# Patient Record
Sex: Female | Born: 1965 | Race: Black or African American | Hispanic: No | Marital: Single | Smoking: Never smoker
Health system: Southern US, Community
[De-identification: ages and names within clinical notes are randomized; demographics above are authoritative.]

## PROBLEM LIST (undated history)

## (undated) DIAGNOSIS — E039 Hypothyroidism, unspecified: Secondary | ICD-10-CM

## (undated) DIAGNOSIS — D649 Anemia, unspecified: Secondary | ICD-10-CM

## (undated) DIAGNOSIS — K219 Gastro-esophageal reflux disease without esophagitis: Secondary | ICD-10-CM

## (undated) DIAGNOSIS — I10 Essential (primary) hypertension: Secondary | ICD-10-CM

## (undated) HISTORY — PX: MYOMECTOMY: SHX85

## (undated) HISTORY — PX: GASTRIC BYPASS: SHX52

---

## 1998-08-17 ENCOUNTER — Encounter: Admission: RE | Admit: 1998-08-17 | Discharge: 1998-08-17 | Payer: Self-pay | Admitting: Sports Medicine

## 1998-08-19 ENCOUNTER — Encounter: Admission: RE | Admit: 1998-08-19 | Discharge: 1998-08-19 | Payer: Self-pay | Admitting: Family Medicine

## 1998-08-30 ENCOUNTER — Encounter: Admission: RE | Admit: 1998-08-30 | Discharge: 1998-08-30 | Payer: Self-pay | Admitting: Family Medicine

## 1998-10-20 ENCOUNTER — Encounter: Admission: RE | Admit: 1998-10-20 | Discharge: 1998-10-20 | Payer: Self-pay | Admitting: Family Medicine

## 1998-10-20 ENCOUNTER — Encounter: Payer: Self-pay | Admitting: Infectious Diseases

## 1998-10-20 ENCOUNTER — Ambulatory Visit (HOSPITAL_COMMUNITY): Admission: RE | Admit: 1998-10-20 | Discharge: 1998-10-20 | Payer: Self-pay | Admitting: Obstetrics and Gynecology

## 1999-01-12 ENCOUNTER — Encounter: Admission: RE | Admit: 1999-01-12 | Discharge: 1999-01-12 | Payer: Self-pay | Admitting: Family Medicine

## 1999-01-27 ENCOUNTER — Encounter: Admission: RE | Admit: 1999-01-27 | Discharge: 1999-01-27 | Payer: Self-pay | Admitting: Family Medicine

## 1999-02-08 ENCOUNTER — Encounter: Admission: RE | Admit: 1999-02-08 | Discharge: 1999-02-08 | Payer: Self-pay | Admitting: Family Medicine

## 1999-03-23 ENCOUNTER — Encounter: Admission: RE | Admit: 1999-03-23 | Discharge: 1999-03-23 | Payer: Self-pay | Admitting: Family Medicine

## 1999-11-22 ENCOUNTER — Encounter: Admission: RE | Admit: 1999-11-22 | Discharge: 1999-11-22 | Payer: Self-pay | Admitting: Sports Medicine

## 1999-11-25 ENCOUNTER — Encounter: Admission: RE | Admit: 1999-11-25 | Discharge: 1999-11-25 | Payer: Self-pay | Admitting: Family Medicine

## 2000-03-14 ENCOUNTER — Emergency Department (HOSPITAL_COMMUNITY): Admission: EM | Admit: 2000-03-14 | Discharge: 2000-03-14 | Payer: Self-pay

## 2000-07-10 ENCOUNTER — Encounter: Admission: RE | Admit: 2000-07-10 | Discharge: 2000-07-10 | Payer: Self-pay | Admitting: Sports Medicine

## 2000-07-29 ENCOUNTER — Emergency Department (HOSPITAL_COMMUNITY): Admission: EM | Admit: 2000-07-29 | Discharge: 2000-07-29 | Payer: Self-pay | Admitting: *Deleted

## 2000-07-30 ENCOUNTER — Emergency Department (HOSPITAL_COMMUNITY): Admission: EM | Admit: 2000-07-30 | Discharge: 2000-07-30 | Payer: Self-pay | Admitting: Emergency Medicine

## 2000-08-01 ENCOUNTER — Encounter: Admission: RE | Admit: 2000-08-01 | Discharge: 2000-08-01 | Payer: Self-pay | Admitting: Family Medicine

## 2001-04-01 ENCOUNTER — Other Ambulatory Visit: Admission: RE | Admit: 2001-04-01 | Discharge: 2001-04-01 | Payer: Self-pay | Admitting: Obstetrics and Gynecology

## 2001-04-05 ENCOUNTER — Ambulatory Visit (HOSPITAL_COMMUNITY): Admission: RE | Admit: 2001-04-05 | Discharge: 2001-04-05 | Payer: Self-pay | Admitting: Obstetrics and Gynecology

## 2001-04-05 ENCOUNTER — Encounter: Payer: Self-pay | Admitting: Obstetrics and Gynecology

## 2001-04-16 ENCOUNTER — Encounter: Admission: RE | Admit: 2001-04-16 | Discharge: 2001-04-16 | Payer: Self-pay | Admitting: Internal Medicine

## 2001-04-16 ENCOUNTER — Encounter: Payer: Self-pay | Admitting: Internal Medicine

## 2001-05-15 ENCOUNTER — Encounter: Admission: RE | Admit: 2001-05-15 | Discharge: 2001-08-13 | Payer: Self-pay | Admitting: Internal Medicine

## 2002-01-14 ENCOUNTER — Encounter: Admission: RE | Admit: 2002-01-14 | Discharge: 2002-04-14 | Payer: Self-pay | Admitting: Internal Medicine

## 2003-04-21 ENCOUNTER — Encounter: Admission: RE | Admit: 2003-04-21 | Discharge: 2003-04-21 | Payer: Self-pay | Admitting: Internal Medicine

## 2003-04-22 ENCOUNTER — Ambulatory Visit (HOSPITAL_COMMUNITY): Admission: RE | Admit: 2003-04-22 | Discharge: 2003-04-22 | Payer: Self-pay | Admitting: Internal Medicine

## 2003-04-24 ENCOUNTER — Ambulatory Visit (HOSPITAL_COMMUNITY): Admission: RE | Admit: 2003-04-24 | Discharge: 2003-04-24 | Payer: Self-pay | Admitting: Internal Medicine

## 2003-07-17 ENCOUNTER — Emergency Department (HOSPITAL_COMMUNITY): Admission: EM | Admit: 2003-07-17 | Discharge: 2003-07-17 | Payer: Self-pay | Admitting: Family Medicine

## 2003-07-19 ENCOUNTER — Emergency Department (HOSPITAL_COMMUNITY): Admission: AD | Admit: 2003-07-19 | Discharge: 2003-07-19 | Payer: Self-pay | Admitting: Family Medicine

## 2003-08-12 ENCOUNTER — Encounter: Admission: RE | Admit: 2003-08-12 | Discharge: 2003-08-12 | Payer: Self-pay | Admitting: General Surgery

## 2003-08-20 ENCOUNTER — Encounter: Admission: RE | Admit: 2003-08-20 | Discharge: 2003-08-20 | Payer: Self-pay | Admitting: General Surgery

## 2003-09-09 ENCOUNTER — Encounter: Payer: Self-pay | Admitting: Cardiology

## 2003-09-09 ENCOUNTER — Ambulatory Visit (HOSPITAL_COMMUNITY): Admission: RE | Admit: 2003-09-09 | Discharge: 2003-09-09 | Payer: Self-pay | Admitting: General Surgery

## 2003-09-25 ENCOUNTER — Encounter: Admission: RE | Admit: 2003-09-25 | Discharge: 2003-10-23 | Payer: Self-pay | Admitting: Family Medicine

## 2003-11-24 ENCOUNTER — Inpatient Hospital Stay (HOSPITAL_COMMUNITY): Admission: RE | Admit: 2003-11-24 | Discharge: 2003-11-27 | Payer: Self-pay | Admitting: General Surgery

## 2004-01-13 ENCOUNTER — Encounter: Admission: RE | Admit: 2004-01-13 | Discharge: 2004-02-24 | Payer: Self-pay | Admitting: Family Medicine

## 2004-04-15 ENCOUNTER — Encounter: Admission: RE | Admit: 2004-04-15 | Discharge: 2004-07-14 | Payer: Self-pay | Admitting: Family Medicine

## 2004-09-24 ENCOUNTER — Inpatient Hospital Stay (HOSPITAL_COMMUNITY): Admission: EM | Admit: 2004-09-24 | Discharge: 2004-09-26 | Payer: Self-pay | Admitting: Emergency Medicine

## 2004-12-07 ENCOUNTER — Emergency Department (HOSPITAL_COMMUNITY): Admission: EM | Admit: 2004-12-07 | Discharge: 2004-12-07 | Payer: Self-pay | Admitting: Emergency Medicine

## 2005-01-13 ENCOUNTER — Encounter: Admission: RE | Admit: 2005-01-13 | Discharge: 2005-01-13 | Payer: Self-pay | Admitting: Occupational Medicine

## 2005-01-31 ENCOUNTER — Emergency Department (HOSPITAL_COMMUNITY): Admission: EM | Admit: 2005-01-31 | Discharge: 2005-01-31 | Payer: Self-pay | Admitting: Family Medicine

## 2005-04-22 ENCOUNTER — Emergency Department (HOSPITAL_COMMUNITY): Admission: EM | Admit: 2005-04-22 | Discharge: 2005-04-22 | Payer: Self-pay | Admitting: Family Medicine

## 2005-08-21 ENCOUNTER — Emergency Department (HOSPITAL_COMMUNITY): Admission: EM | Admit: 2005-08-21 | Discharge: 2005-08-21 | Payer: Self-pay | Admitting: Family Medicine

## 2005-12-21 ENCOUNTER — Emergency Department (HOSPITAL_COMMUNITY): Admission: EM | Admit: 2005-12-21 | Discharge: 2005-12-21 | Payer: Self-pay | Admitting: Family Medicine

## 2006-05-02 ENCOUNTER — Emergency Department (HOSPITAL_COMMUNITY): Admission: EM | Admit: 2006-05-02 | Discharge: 2006-05-02 | Payer: Self-pay | Admitting: Family Medicine

## 2008-06-02 ENCOUNTER — Ambulatory Visit: Payer: Self-pay | Admitting: Diagnostic Radiology

## 2008-06-02 ENCOUNTER — Emergency Department (HOSPITAL_BASED_OUTPATIENT_CLINIC_OR_DEPARTMENT_OTHER): Admission: EM | Admit: 2008-06-02 | Discharge: 2008-06-02 | Payer: Self-pay | Admitting: Emergency Medicine

## 2008-06-03 ENCOUNTER — Emergency Department (HOSPITAL_BASED_OUTPATIENT_CLINIC_OR_DEPARTMENT_OTHER): Admission: EM | Admit: 2008-06-03 | Discharge: 2008-06-03 | Payer: Self-pay | Admitting: Emergency Medicine

## 2008-06-03 ENCOUNTER — Ambulatory Visit (HOSPITAL_BASED_OUTPATIENT_CLINIC_OR_DEPARTMENT_OTHER): Admission: RE | Admit: 2008-06-03 | Discharge: 2008-06-03 | Payer: Self-pay | Admitting: Emergency Medicine

## 2008-06-03 ENCOUNTER — Ambulatory Visit: Payer: Self-pay | Admitting: Diagnostic Radiology

## 2008-11-17 ENCOUNTER — Emergency Department (HOSPITAL_BASED_OUTPATIENT_CLINIC_OR_DEPARTMENT_OTHER): Admission: EM | Admit: 2008-11-17 | Discharge: 2008-11-17 | Payer: Self-pay | Admitting: Emergency Medicine

## 2008-11-17 ENCOUNTER — Ambulatory Visit: Payer: Self-pay | Admitting: Diagnostic Radiology

## 2008-11-19 ENCOUNTER — Encounter: Admission: RE | Admit: 2008-11-19 | Discharge: 2008-11-19 | Payer: Self-pay | Admitting: Obstetrics and Gynecology

## 2008-12-08 ENCOUNTER — Ambulatory Visit (HOSPITAL_BASED_OUTPATIENT_CLINIC_OR_DEPARTMENT_OTHER): Admission: RE | Admit: 2008-12-08 | Discharge: 2008-12-08 | Payer: Self-pay | Admitting: Obstetrics and Gynecology

## 2008-12-08 ENCOUNTER — Ambulatory Visit: Payer: Self-pay | Admitting: Diagnostic Radiology

## 2009-07-16 ENCOUNTER — Ambulatory Visit: Payer: Self-pay | Admitting: Diagnostic Radiology

## 2009-07-16 ENCOUNTER — Emergency Department (HOSPITAL_BASED_OUTPATIENT_CLINIC_OR_DEPARTMENT_OTHER): Admission: EM | Admit: 2009-07-16 | Discharge: 2009-07-16 | Payer: Self-pay | Admitting: Emergency Medicine

## 2010-04-01 ENCOUNTER — Emergency Department (HOSPITAL_BASED_OUTPATIENT_CLINIC_OR_DEPARTMENT_OTHER)
Admission: EM | Admit: 2010-04-01 | Discharge: 2010-04-02 | Payer: Self-pay | Source: Home / Self Care | Admitting: Emergency Medicine

## 2010-04-01 ENCOUNTER — Ambulatory Visit: Payer: Self-pay | Admitting: Diagnostic Radiology

## 2010-04-28 IMAGING — US US TRANSVAGINAL NON-OB
1 series · 13 of 25 positions shown · non-contrast
Comparison: CT scan [DATE].  Pelvic ultrasound 06/03/2008.

CLINICAL DATA: Abnormal CT of the abdomen and pelvis.  Fibroids.
Progressive left lower quadrant abdominal pain pain radiating into
the low back

TRANSABDOMINAL AND TRANSVAGINAL ULTRASOUND OF PELVIS
TECHNIQUE: Both transabdominal and transvaginal ultrasound
examinations of the pelvis were performed including evaluation of
the uterus, ovaries, adnexal regions, and pelvic cul-de-sac.

[Series 1: us transvaginal non-ob · 0.33mm/px · 13 of 95 slices shown]
[im 1/95]
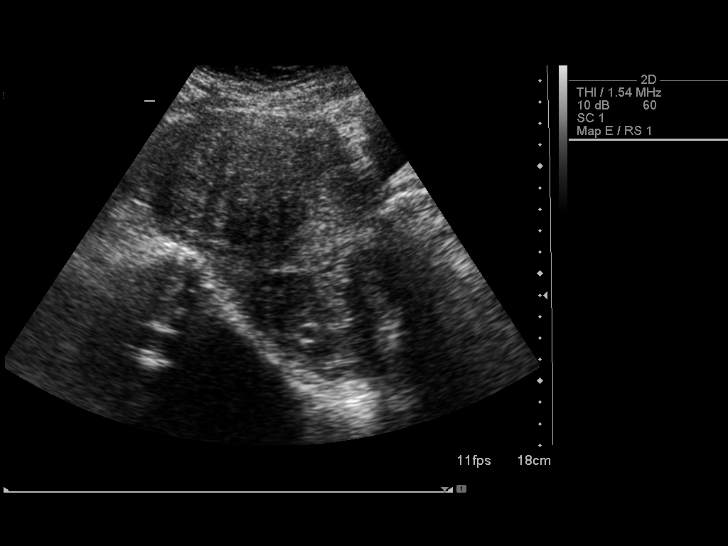
[im 8/95]
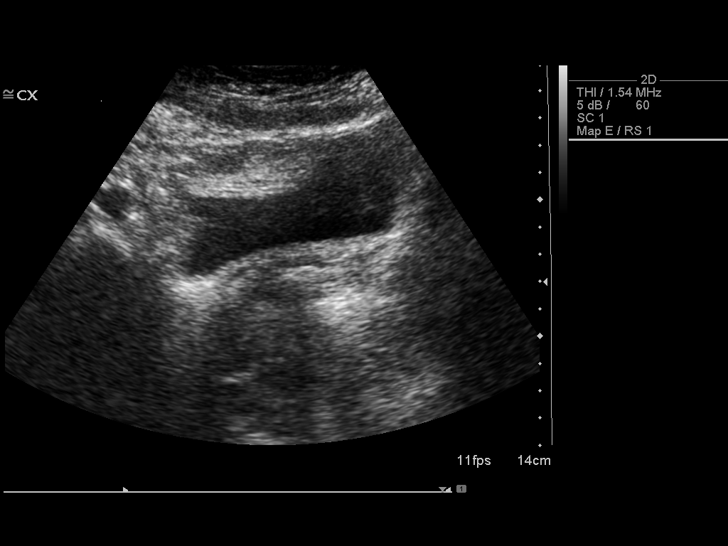
[im 16/95]
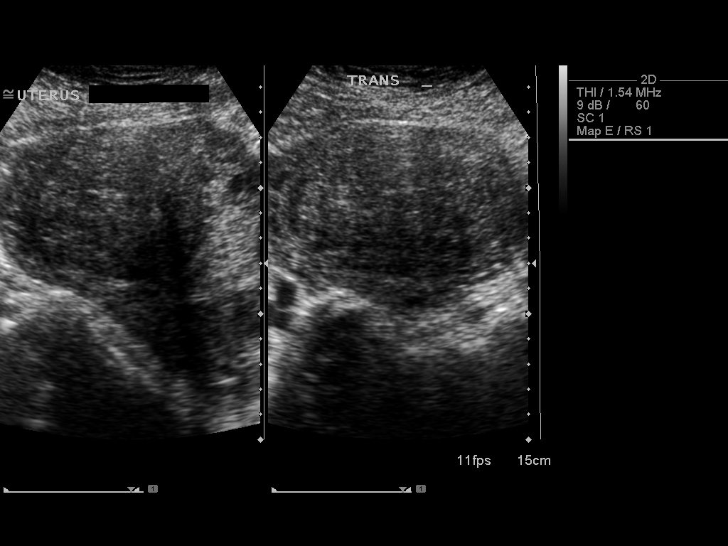
[im 24/95]
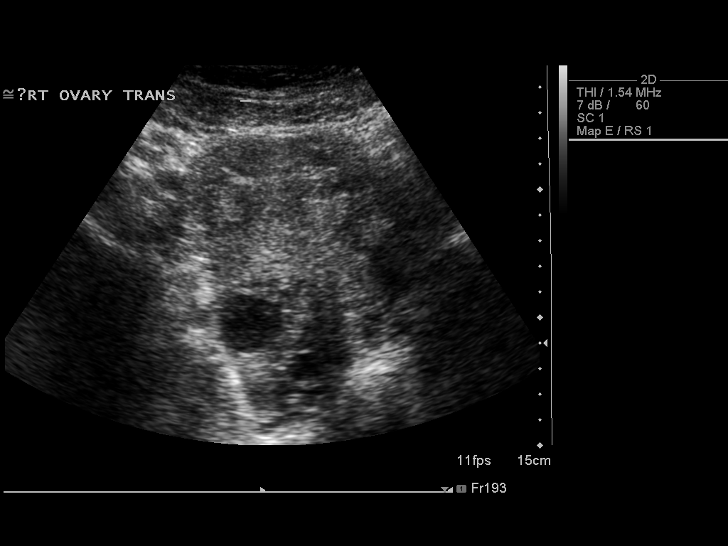
[im 32/95]
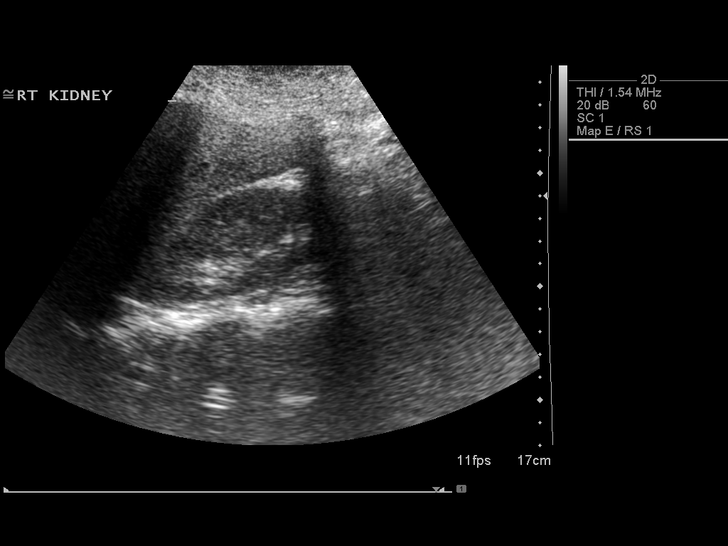
[im 40/95]
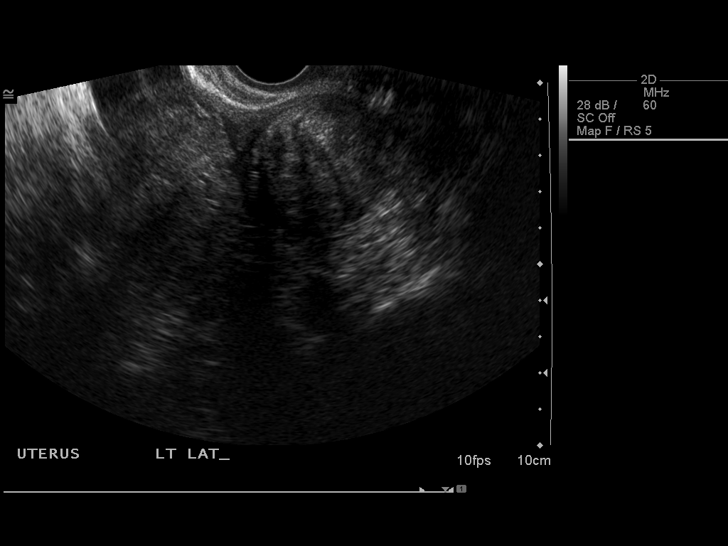
[im 48/95]
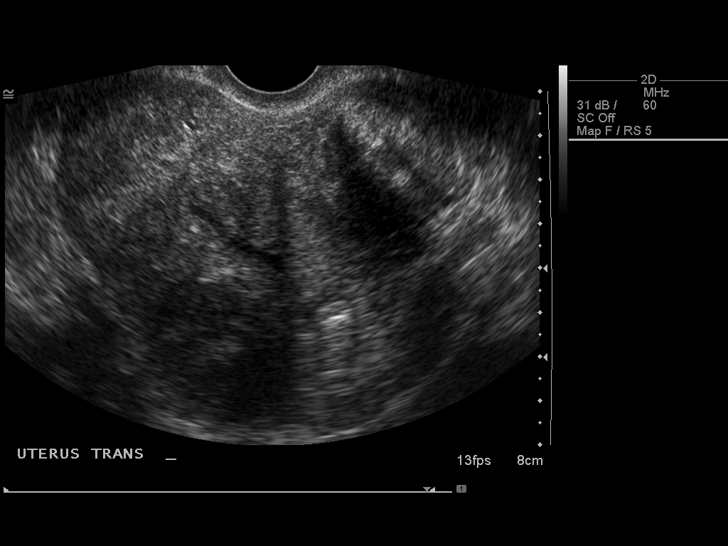
[im 55/95]
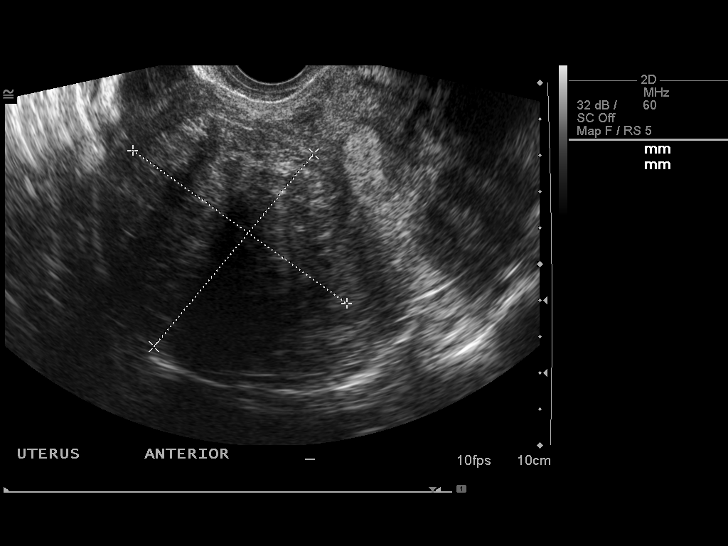
[im 63/95]
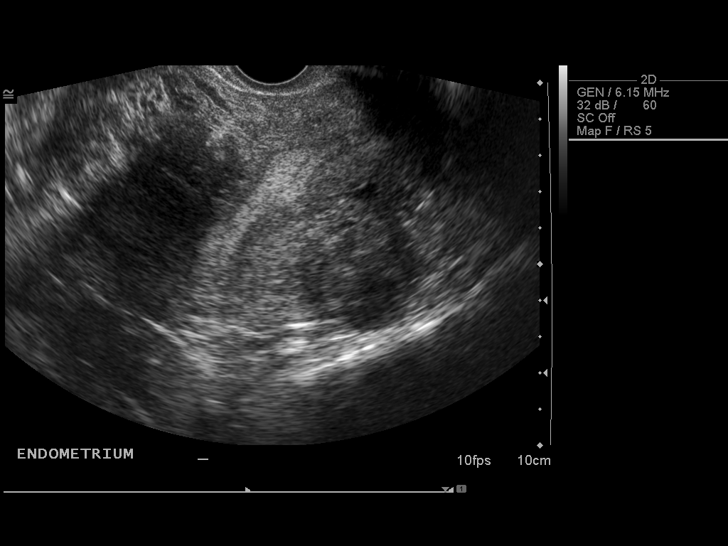
[im 71/95]
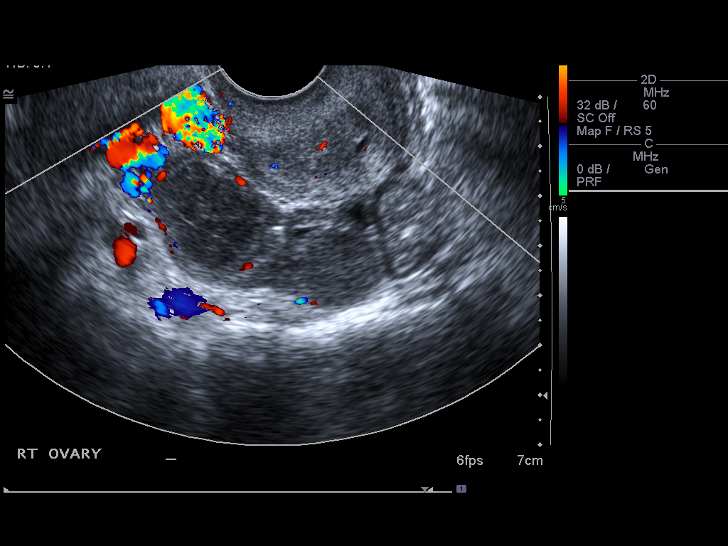
[im 79/95]
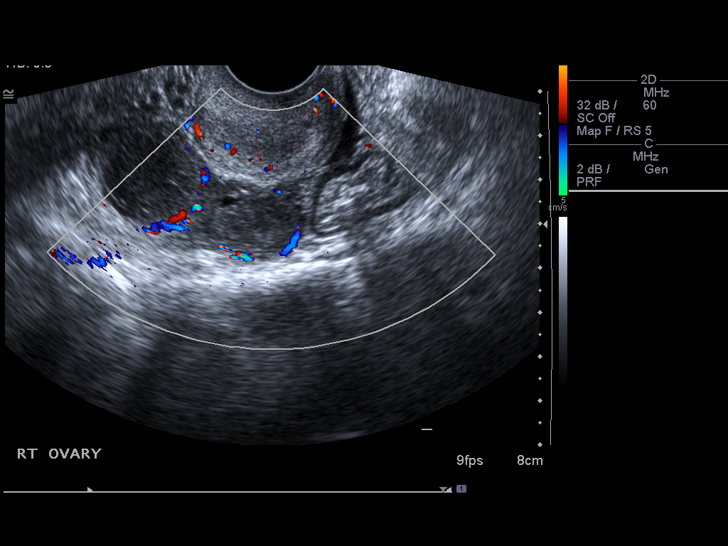
[im 87/95]
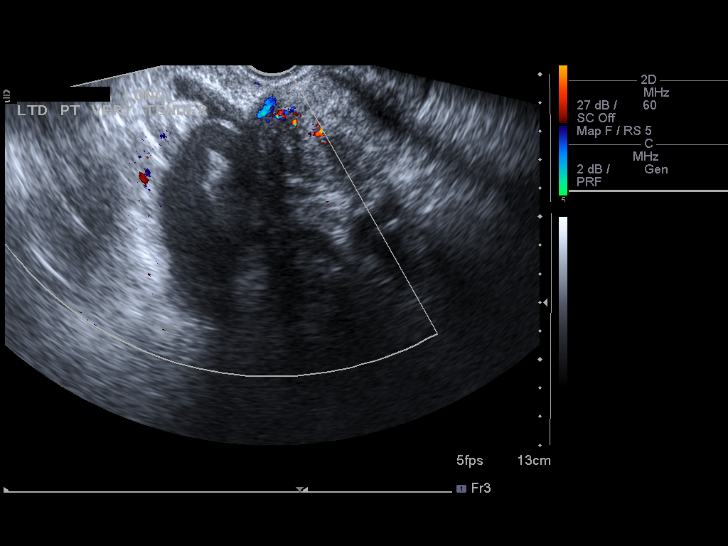
[im 95/95]
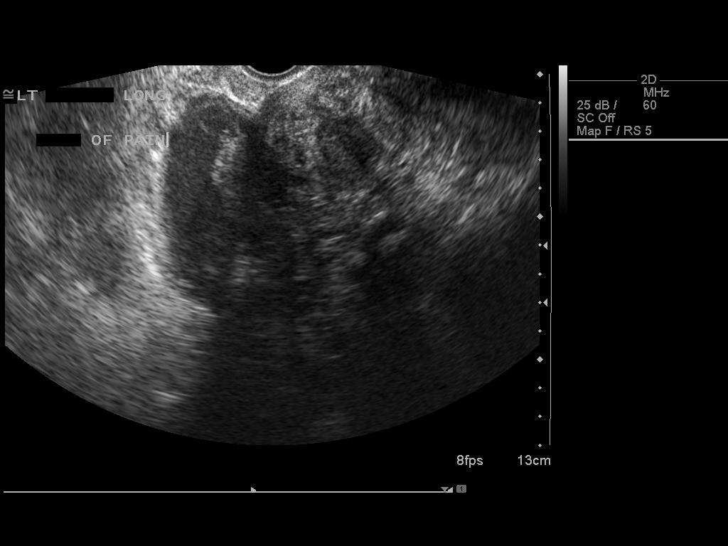

[13 of 25 positions shown; findings below may reference images not displayed]

FINDINGS: Uterus:  The uterus is enlarged measuring 17.0 x 10.0 x 10.6 cm.
Multiple uterine fibroids are again seen.  The largest lesion is
within the fundus, measuring 6.8 x 7.8 x 8.9 cm.  More posteriorly
the dominant lesion measures 4.1 x 3.5 x 3.7 cm.  The largest
lesion on the left side of uterus measures 5.5 x 3.8 x 4.5 cm. This
represents significant interval growth of these fibroids.  No
definite new fibroids are seen.

Endometrium:  The endometrial canal is somewhat distorted by the
fibroids.  It measures 13.6 mm maximally.

Right Ovary:  The right ovary measures 4.9 x 2.2 x 4.2 cm.  There
are two complex cystic lesions within the ovary.  They measure
x 2.2 x 2.6 cm and 2.3 x 1.7 x 2.2 cm respectively.

Left Ovary:  The left ovary is of normal size and echo texture
measuring 3.9 x 3.4 x 3.1 cm.

Other Findings:  A tubular structure is again seen adjacent to the
left adnexa.  This is slightly smaller than on the previous exam
but still measures up to 2 cm in maximal width.  A small amount of
free fluid is noted.
IMPRESSION: 1.  Interval enlargement of multiple uterine fibroids.
2.  Persistent left sided hydrosalpinx.  This is in the area of the
patient's tenderness.
3.  Enlarging complex cystic lesions of the right ovary.  These are
not clearly simple follicles.  Hemorrhagic cysts are considered.
Neoplasm is considered less likely.  Recommend follow-up ultrasound
at 3-6 months.

## 2010-06-27 ENCOUNTER — Encounter: Payer: Self-pay | Admitting: Obstetrics and Gynecology

## 2010-08-03 ENCOUNTER — Emergency Department (INDEPENDENT_AMBULATORY_CARE_PROVIDER_SITE_OTHER): Payer: Managed Care, Other (non HMO)

## 2010-08-03 ENCOUNTER — Emergency Department (HOSPITAL_BASED_OUTPATIENT_CLINIC_OR_DEPARTMENT_OTHER)
Admission: EM | Admit: 2010-08-03 | Discharge: 2010-08-03 | Disposition: A | Discharge status: Home / Self Care | Payer: Managed Care, Other (non HMO) | Attending: Emergency Medicine | Admitting: Emergency Medicine

## 2010-08-03 DIAGNOSIS — I10 Essential (primary) hypertension: Secondary | ICD-10-CM | POA: Insufficient documentation

## 2010-08-03 DIAGNOSIS — M65979 Unspecified synovitis and tenosynovitis, unspecified ankle and foot: Secondary | ICD-10-CM | POA: Insufficient documentation

## 2010-08-03 DIAGNOSIS — M25579 Pain in unspecified ankle and joints of unspecified foot: Secondary | ICD-10-CM

## 2010-08-03 DIAGNOSIS — E039 Hypothyroidism, unspecified: Secondary | ICD-10-CM | POA: Insufficient documentation

## 2010-08-03 DIAGNOSIS — M79609 Pain in unspecified limb: Secondary | ICD-10-CM

## 2010-08-03 DIAGNOSIS — M659 Synovitis and tenosynovitis, unspecified: Secondary | ICD-10-CM | POA: Insufficient documentation

## 2010-08-17 LAB — URINALYSIS, ROUTINE W REFLEX MICROSCOPIC
Bilirubin Urine: NEGATIVE
Glucose, UA: NEGATIVE mg/dL
Hgb urine dipstick: NEGATIVE
Specific Gravity, Urine: 1.027 (ref 1.005–1.030)

## 2010-09-12 LAB — URINALYSIS, ROUTINE W REFLEX MICROSCOPIC
Bilirubin Urine: NEGATIVE
Glucose, UA: NEGATIVE mg/dL
Hgb urine dipstick: NEGATIVE
Ketones, ur: NEGATIVE mg/dL
pH: 6 (ref 5.0–8.0)

## 2010-09-12 LAB — PREGNANCY, URINE: Preg Test, Ur: NEGATIVE

## 2010-10-21 NOTE — Discharge Summary (Signed)
[name], [name]                       ACCOUNT NO.:  [id number]   MEDICAL RECORD NO.:  [id number]                   PATIENT TYPE:  INP   LOCATION:  0454                                 FACILITY:  [facility]   PHYSICIAN:  Sharlet Salina T. Hoxworth, M.D.          DATE OF BIRTH:  1965/10/06   DATE OF ADMISSION:  11/24/2003  DATE OF DISCHARGE:  11/27/2003                                 DISCHARGE SUMMARY   DISCHARGE DIAGNOSES:  Morbid obesity.   OPERATION/PROCEDURE:  Laparoscopic Roux-Y gastric bypass November 24, 2003, Dr.  Johna Sheriff.   HISTORY OF PRESENT ILLNESS:  [name] is a 45 year old black female  with a long history of progressive morbid obesity unresponsive to medical  management.  After extensive preoperative evaluation and discussion, we have  elected to proceed with laparoscopic Roux-Y gastric bypass for correction of  her obesity.  She is admitted for this procedure.   PAST MEDICAL HISTORY:  She has had no surgeries.  She is followed for  hypertension, hypothyroidism, and mild iron deficiency anemia.   MEDICATIONS:  1. Hydrochlorothiazide 37.5 mg a day.  2. Synthroid 75 mcg daily.  3. Chromagen daily.   No known drug allergies.   Social History, Family History, Review of Systems as in the dictated H&P.   PERTINENT PHYSICAL EXAMINATION:  VITAL SIGNS:  She is 5 feet 7 inches, 325  pounds for a BMI of 51.1.  The remainder of physical exam was unremarkable.   HOSPITAL COURSE:  The patient was admitted on the morning of her procedure  and underwent an uneventful laparoscopic Roux-Y gastric bypass.  Her  postoperative course was smooth.  She had normal Gastrografin swallow on the  first postoperative day.  Vital signs were stable.  She was begun on clear  liquids and was able to be advanced  to full liquids.  She steadily improved and was felt ready for discharge on  June 24th.  She was afebrile, vital signs were stable, abdomen is benign,  tolerating diet.   FOLLOW  UP:  In my office in 1 week.                                               Lorne Skeens. Hoxworth, M.D.    Tory Emerald  D:  12/09/2003  T:  12/09/2003  Job:  [id number]   cc:   Candyce Churn. Allyne Gee, M.D.  [address]  [address]  [place]  [place] [address]  Fax: [phone]

## 2010-10-21 NOTE — Op Note (Signed)
[name]             ACCOUNT NO.:  [id number]   MEDICAL RECORD NO.:  [id number]          PATIENT TYPE:  INP   LOCATION:  0446                         FACILITY:  [facility]   PHYSICIAN:  Sharlet Salina T. Hoxworth, M.D.DATE OF BIRTH:  May 06, 1966   DATE OF PROCEDURE:  09/24/2004  DATE OF DISCHARGE:                                 OPERATIVE REPORT   PREOPERATIVE DIAGNOSIS:  Small bowel obstruction.   POSTOPERATIVE DIAGNOSIS:  Small bowel obstruction, secondary to single  adhesive band.   SURGICAL PROCEDURE:  Laparoscopic lysis of adhesions for small bowel  obstruction.   SURGEON:  Dr. Johna Sheriff   ASSISTANT:  Dr. Darnell Level   ANESTHESIA:  General.   BRIEF HISTORY:  [name] is a 45 year old female, 9 months following  uncomplicated laparoscopic Roux-Y gastric bypass for morbid obesity.  She  has done well with excellent weight loss of over 100 pounds and has not had  any complaints of abdominal pain up to this point.  She now, however,  presents with 18 hours of crampy mid abdominal pain, and CT scan been  obtained showing evidence of small bowel obstruction with multiple loops of  dilated small bowel and decompressed distal small bowel.  No definite  internal hernia was able to be seen on CT.  Laparoscopic exploration for  bowel obstruction has been recommended and accepted.  The nature of the  procedure, indications, risks of bleeding, infection, intestinal injury,  possible need for open procedure were discussed and understood.  She is now  brought to the operating room for this procedure.   DESCRIPTION OF OPERATION:  The patient brought to the operating room, placed  in supine position on operating table, and general endotracheal anesthesia  was induced.  PAS were in place.  She received broad spectrum preoperative  antibiotics.  The abdomen was widely sterilely prepped and draped.  An  OptiView trocar was used to access the abdomen without difficulty through  the  previous left subcostal incision.  A pneumoperitoneum was established.  Laparoscopy revealed multiple dilated loops of small bowel and some  decompressed distal small bowel.  A second 11 mm trocar was placed in the  right mid abdomen.  There are some omental adhesions to the anterior  abdominal wall at a couple of sites and were taken down with sharp  dissection.  Another 11 mm trocar was placed in the right subcostal space  and another 11 mm trocar in the right periumbilical space, all through  previous laparoscopic incision sites.  The decompressed terminal ileum was  identified and using atraumatic graspers the bowel was traced proximally.  At about the mid ileum, the bowel was obviously tethered down toward the  mesentery, and this area was able to be exposed, and there was a single  dense adhesive band from mesentery to mesentery that had obstructed the  bowel at this point.  This clearly was not through an internal hernia and  was distal to where you would expect to encounter the jejunojejunostomy.  Right angle was able to be slipped easily under the band, and it was seen  not to  be particularly avascular.  Clips were applied to prevent any  bleeding and it was sharply divided and the bowel was immediately freed.  The band was then actually clipped more towards its origin of the base of  the mesentery and removed.  There was a clear transition zone at this point  between completely decompressed distal bowel and markedly dilated proximal  bowel.  I traced the dilated proximal bowel proximally; however, due to the  significantly distended multiple loops of bowel, there was not much room for  careful exploration, and I felt that risk of injuring the bowel was not  worth further exploration, as clearly the entire remainder of the proximal  bowel was dilated, and this was not the result of any internal hernia that  required repair.  I was, however, not really able to safely visualize the   jejunojejunostomy.  We were, however, confident that the obstruction was  relieved.  At this point, trocars were removed under direct vision, all CO2  evacuated.  Skin incisions were closed with staples.  The patient taken to  recovery in good condition.      BTH/MEDQ  D:  09/24/2004  T:  09/24/2004  Job:  [id number]

## 2010-10-21 NOTE — H&P (Signed)
[name], [name]             ACCOUNT NO.:  [id number]   MEDICAL RECORD NO.:  [id number]          PATIENT TYPE:  INP   LOCATION:  0101                         FACILITY:  [facility]   PHYSICIAN:  Angelia Mould. Derrell Lolling, M.D.DATE OF BIRTH:  [birth year]   DATE OF ADMISSION:  09/23/2004  DATE OF DISCHARGE:                                HISTORY & PHYSICAL   CHIEF COMPLAINT:  Abdominal pain.   HISTORY OF PRESENT ILLNESS:  This is a 45 year old black female who  underwent an uneventful Roux-en-Y gastric bypass for morbid obesity on November 24, 2003 by Dr. Johna Sheriff. She has done well with good weight loss, having  lost from 341 pounds down to 223 pounds.   Yesterday she had lunch at [employer] and about 20 minutes after eating she  developed sudden onset of crampy, centralized abdominal pain. She stated  that the pain would come and go, was moderately severe, would tend to occur  on the right side, left side, and even in the back. She called one of the  nutritional nurses and according to her was advised to eat bland foods, and  when she ate some further food the pain hurt worse. She was not vomiting at  home. Her bowel movements have been normal and daily although she did not  have a bowel movement yesterday. She came to the [facility] emergency room,  has received Phenergan and Dilaudid, and states that the pain has now  resolved. She had abdominal x-rays which were not remarkable but she had a  CT scan which showed a diltation of the jejunum with collapse of the ileum  and colon. There was no free air, minimal free fluid, no bowel wall  thickening. She is being admitted for further evaluation and management.   PAST HISTORY:  Laparoscopic Roux-en-Y gastric bypass November 24, 2003. She has  not had any other surgeries. She has hypertension. She had hypothyroidism.  She has a history of mild anemia.   CURRENT MEDICATIONS:  1.  Synthroid 100 mcg per day.  2.  Triamterene 37.5 mg a day.  3.   Prenatal vitamins.   DRUG ALLERGIES:  None known.   SOCIAL HISTORY:  She is single, lives alone in [place], has no children.  Works as a Paediatric nurse on the weekends and works for the [employer]  on [employer] during the day. Denies the use of alcohol or tobacco.   FAMILY HISTORY:  Mother living, has hypertension and fibromyalgia. Father  living, has hypertension. She has a brother and a sister both living and  well.   REVIEW OF SYSTEMS:  Fifteen-system review of systems was performed. It is  noncontributory except as described above.   PHYSICAL EXAMINATION:  GENERAL:  Obese black female in minimal distress.  Cooperative.  VITAL SIGNS:  Temperature 97.1, pulse 100, respirations 18, blood pressure  138/88.  HEENT:  Eyes:  Sclerae clear, extraocular movements intact, no exophthalmos.  Ear, nose, mouth and throat, nose, lips, tongue, and oropharynx are without  gross lesions.  NECK:  Supple, nontender, no mass, no jugular venous distention.  LUNGS:  Clear to auscultation. No chest wall tenderness.  HEART:  Regular rate and rhythm. Radial, femoral, and retrotibial pulses are  palpable.  BREASTS:  Not examined.  ABDOMEN:  Somewhat obese, soft, with minimal tenderness, no guarding, no  rebound. There is no mass. There is no hernia. I cannot feel any enlargement  of the liver or spleen. She may be slightly tympanitic and may be slightly  distended but this is very subtle. All the trocar sites are well healed.  EXTREMITIES:  She moves all four extremities well without pain or deformity.  NEUROLOGIC:  No gross motor or sensory deficits.   ADMISSION DATA:  CT scan suggesting either ileus or proximal small-bowel  obstruction as described above. Potassium of 2.8. She also has a calcium of  9.1. Liver function tests are pending. Lipase of 15. White blood cell count  is 12,500.   IMPRESSION:  1.  Abdominal pain. This is possibly secondary to a partial small-bowel       obstruction either from adhesions or an internal hernia, or could be due      to motility disorder from her hypokalemia.  2.  There is no evidence of compromised bowel at this time.  3.  Status post laparoscopic Roux-en-Y gastric bypass.  4.  Hypokalemia.  5.  Hypertension.  6.  Hypothyroidism.   PLAN:  1.  The patient will be admitted for observation. We will keep her n.p.o.      and repeat her labs and x-rays tomorrow.  2.  I have discussed her care with Dr. Johna Sheriff who agrees with conservative      management at this point. Dr. Johna Sheriff says he will stop by to see her      sometime in the next 24 hours.      HMI/MEDQ  D:  09/24/2004  T:  09/24/2004  Job:  [id number]   cc:   Candyce Churn. Allyne Gee, M.D.  [address]  [address]  [place]  [place] [address]  Fax: [phone]   Lorne Skeens. Hoxworth, M.D.  [address], [address]  [place]  [place] [address]

## 2010-10-21 NOTE — Discharge Summary (Signed)
[name], [name]             ACCOUNT NO.:  [id number]   MEDICAL RECORD NO.:  [id number]          PATIENT TYPE:  INP   LOCATION:  0446                         FACILITY:  [facility]   PHYSICIAN:  Sharlet Salina T. Hoxworth, M.D.DATE OF BIRTH:  [birth year]   DATE OF ADMISSION:  09/23/2004  DATE OF DISCHARGE:  09/26/2004                                 DISCHARGE SUMMARY   DISCHARGE DIAGNOSIS:  Small bowel obstruction secondary to single adhesive  bands.   PRINCIPAL PROCEDURE:  Laparoscopic lysis of adhesions for small bowel  obstruction on September 24, 2004.   HISTORY OF PRESENT ILLNESS:  [name] is a 45 year old female status  post uncomplicated laparoscopic Roux-en-Y gastric bypass in July 2005.  She  has had excellent weight loss and has done very well following surgery until  24 hours prior to admission when she developed crampy pressure-like  midabdominal pain.  She presented to the emergency room and continues to  have pain.  CT scans obtained which shows small bowel obstruction.  Urgent  laparoscopy has been recommended and accepted, the patient is admitted for  this procedure.   PAST MEDICAL HISTORY:  Significant as above, plus history of hypertension,  hypothyroidism.   MEDICATIONS:  1.  Synthroid 100 mcg daily.  2.  Triamterine 37.5 daily.   NO KNOWN DRUG ALLERGIES.   SOCIAL HISTORY/FAMILY HISTORY/REVIEW OF SYSTEMS:  See dictated H&P.   PHYSICAL EXAMINATION:  She is afebrile, pulse 100, respirations 18, blood  pressure 130/88.  GENERAL:  A mildly obese, in some pain.  ABDOMEN:  Shows some moderate diffuse tenderness.   CT findings as above.   HOSPITAL COURSE:  The patient was admitted and underwent laparoscopy and was  found to have a single adhesive band causing a distal small bowel  obstruction, unrelated to any internal hernia.  The patient felt better  immediately postoperatively.  She was started on a clear liquid diet on the  first postoperative day which she  tolerated well.  By the second  postoperative day she was up and ambulatory, had minimal abdominal  discomfort, abdomen was nontender.  She was discharged home at this time.  Follow-up is to be in my office in two weeks.  Discharge medications are the  same as admission plus Vicodin as needed for pain.    BTH/MEDQ  D:  11/08/2004  T:  11/08/2004  Job:  [id number]

## 2010-10-21 NOTE — Op Note (Signed)
[name], [name]                       ACCOUNT NO.:  [id number]   MEDICAL RECORD NO.:  [id number]                   PATIENT TYPE:  INP   LOCATION:  0454                                 FACILITY:  [facility]   PHYSICIAN:  Vikki Ports, M.D.         DATE OF BIRTH:  1966-05-28   DATE OF PROCEDURE:  11/24/2003  DATE OF DISCHARGE:                                 OPERATIVE REPORT   PREOPERATIVE DIAGNOSIS:  Morbid obesity.   POSTOPERATIVE DIAGNOSIS:  Morbid obesity, no leak, patent anastomosis.   PROCEDURE:  Upper endoscopy.   SURGEON:  Danna Hefty, M.D.   DESCRIPTION OF PROCEDURE:  At the conclusion of laparoscopic Roux-en-Y  gastric bypass, I introduced an Olympus endoscope into the oropharynx.  The  esophagus was inspected and was normal.  The Z-line was found to be at 40  cm.  Pouch was inspected and was approximately 5 cm in length.  Anastomosis  was patent.  No evidence of leak.  It was fully distended.  Scope was  removed.  The patient tolerated the procedure well.  The remainder of the  report will be dictated by Jaclynn Guarneri, MD.                                               Vikki Ports, M.D.    KRH/MEDQ  D:  11/25/2003  T:  11/25/2003  Job:  [id number]

## 2010-10-21 NOTE — Op Note (Signed)
[name], [name]                       ACCOUNT NO.:  [id number]   MEDICAL RECORD NO.:  [id number]                   PATIENT TYPE:  INP   LOCATION:  X001                                 FACILITY:  [facility]   PHYSICIAN:  Sharlet Salina T. Hoxworth, M.D.          DATE OF BIRTH:  06/19/1965   DATE OF PROCEDURE:  11/24/2003  DATE OF DISCHARGE:                                 OPERATIVE REPORT   PREOPERATIVE DIAGNOSIS:  Morbid obesity.   POSTOPERATIVE DIAGNOSIS:  Morbid obesity.   OPERATION/PROCEDURE:  Laparoscopic Roux-Y gastric bypass.   SURGEON:  Lorne Skeens. Hoxworth, M.D.   ASSISTANT:  Vikki Ports, M.D.   ANESTHESIA:  General.   BRIEF HISTORY:  [name] is a 45 year old black female with a long  history of progressive morbid obesity with co-morbidities of hypertension  and early arthritis.  She has failed repeated nonsurgical attempts at weight  loss and after extensive discussion and evaluation detailed elsewhere, we  have elected to proceed with laparoscopic Roux-Y gastric bypass for  correction of her obesity.   DESCRIPTION OF PROCEDURE:  The patient was brought to the operating room,  placed in the supine position on the operating table and general  endotracheal anesthesia was induced.  She had undergone a mechanical  antibiotic bowel prep.  Lovenox 40 mg had been given subcutaneous.  PAS were  in place.  The abdomen was widely sterilely prepped and draped.  She was  given preoperative IV antibiotics.  Local anesthesia was infiltrated at the  trocar sites prior to the incisions.   A 1 cm incision was made in the left subcostal space, mid abdomen and using  an Optiview trocar, access was gained to the peritoneal cavity without  difficulty and pneumoperitoneum was established.  Under direct vision, 11 mm  trocars were placed in the right mid abdomen just off the midline, left mid  abdomen, right upper quadrant through the falciform ligament and a 5 mm  trocar  placed in the left flank.  The omentum and transverse mesocolon were  elevated and ligament of Treitz identified.  A 40 cm afferent small bowel  limb was measured and at this point, the bowel was quite mobile up toward  the liver.  The bowel was divided with a single firing of the endo-GIA  vascular 45 mm stapler.  The mesentery was then further divided a short  distance with the Harmonic scalpel to gain increased mobility.  Both ends of  the bowel were pink and healthy-appearing.  The end of the efferent limb was  marked by suturing a Penrose drain to it.  A 100 cm efferent limb was then  measured.  At this point a jejunojejunostomy was created with enterotomies  with Harmonic scalpel and a single firing of a endo-GIA 45 mm vascular  stapler.  The common enterotomy was closed with running 2-0 Vicryl beginning  at either end of the enterotomy and tied centrally.  The  mesenteric defect  behind the afferent limb was closed with a running 2-0 silk carrying this  out onto the jejunojejunostomy to completely close this defect.  Tisseel  tissue sealant was used over the stapled suture lines.   Following this, the patient was placed in the reverse Trendelenburg.  A 5 mm  trocar was placed in the epigastrium and through this opening, a large  Nathenson retractor was placed and the left lobe of the liver elevated with  excellent exposure of the hiatus and upper stomach.  The angle of His was  mobilized, dividing peritoneum at this point and mobilizing angle of His off  the left crus.  A point along the lesser curve was chosen for division of  the stomach, 4-5 cm from the EG junction.  All tubes were removed from the  stomach.  Peritoneum was dissected along the lesser curve and dissection was  carried back posterior to the stomach at right angles for several  centimeters.  Additional firing of endo-GIA blue-load stapler was performed  at right angles to the lesser curve.  Further dissection behind  the gastric  pouch entered the free lesser sac.  Three more firings of the stapler were  used to create a small tubular pouch up to the angle of His which was  completely divided with the last firing.  Following this, the efferent limb  was brought up without undue tension.  A posterior row of running 2-0 Vicryl  was placed between the efferent limb and the gastric pouch.  Ewald tube was  advanced into the pouch and enterotomies created in the distal pouch and  efferent limb.  Through these enterotomies, the endo-GIA 45 mm blue-load  stapler was introduced 2-2.5 cm without difficulty and fired creating a nice  anastomosis with tacked staple line and no bleeding.  The common enterotomy  was then closed from either end with running 2-0 Vicryl.  The Ewald tube was  then advanced through the anastomosis and an outer layer of seromuscular  running 2-0 Vicryl was placed.  Following this, the efferent limb was  clamped and the patient was endoscoped with nice distention of the pouch and  under saline, there was no evidence of any leak.  Anastomosis appeared  patent.  There was no bleeding.  The air was suctioned.  The Tisseel tissue  sealant was used over the suture and staple lines of the gastric pouch and  gastrojejunostomy.  A closed suction drain was brought out through one of  the lateral trocar sites and left in the subhepatic space.  Trocars were  removed and all CO2 evacuated.  Sponge, needle and instrument counts were  correct.  Skin incisions were closed with staples.  The patient was taken to  the recovery room in good condition having tolerated the procedure well.                                               Lorne Skeens. Hoxworth, M.D.    Tory Emerald  D:  11/24/2003  T:  11/24/2003  Job:  [id number]

## 2010-12-28 ENCOUNTER — Other Ambulatory Visit (HOSPITAL_BASED_OUTPATIENT_CLINIC_OR_DEPARTMENT_OTHER): Payer: Self-pay | Admitting: Internal Medicine

## 2010-12-28 DIAGNOSIS — Z1231 Encounter for screening mammogram for malignant neoplasm of breast: Secondary | ICD-10-CM

## 2010-12-30 ENCOUNTER — Ambulatory Visit (HOSPITAL_BASED_OUTPATIENT_CLINIC_OR_DEPARTMENT_OTHER): Payer: Managed Care, Other (non HMO)

## 2011-01-09 ENCOUNTER — Ambulatory Visit (HOSPITAL_BASED_OUTPATIENT_CLINIC_OR_DEPARTMENT_OTHER): Payer: Managed Care, Other (non HMO)

## 2011-01-16 ENCOUNTER — Ambulatory Visit (HOSPITAL_BASED_OUTPATIENT_CLINIC_OR_DEPARTMENT_OTHER)
Admission: RE | Admit: 2011-01-16 | Discharge: 2011-01-16 | Disposition: A | Discharge status: Home / Self Care | Payer: Managed Care, Other (non HMO) | Source: Ambulatory Visit | Attending: Internal Medicine | Admitting: Internal Medicine

## 2011-01-16 DIAGNOSIS — Z1231 Encounter for screening mammogram for malignant neoplasm of breast: Secondary | ICD-10-CM

## 2011-02-25 ENCOUNTER — Encounter: Payer: Self-pay | Admitting: *Deleted

## 2011-02-25 ENCOUNTER — Emergency Department (HOSPITAL_BASED_OUTPATIENT_CLINIC_OR_DEPARTMENT_OTHER)
Admission: EM | Admit: 2011-02-25 | Discharge: 2011-02-25 | Disposition: A | Discharge status: Home / Self Care | Payer: Managed Care, Other (non HMO) | Attending: Emergency Medicine | Admitting: Emergency Medicine

## 2011-02-25 DIAGNOSIS — K219 Gastro-esophageal reflux disease without esophagitis: Secondary | ICD-10-CM | POA: Insufficient documentation

## 2011-02-25 DIAGNOSIS — E079 Disorder of thyroid, unspecified: Secondary | ICD-10-CM | POA: Insufficient documentation

## 2011-02-25 DIAGNOSIS — I10 Essential (primary) hypertension: Secondary | ICD-10-CM | POA: Insufficient documentation

## 2011-02-25 HISTORY — DX: Anemia, unspecified: D64.9

## 2011-02-25 HISTORY — DX: Hypothyroidism, unspecified: E03.9

## 2011-02-25 HISTORY — DX: Essential (primary) hypertension: I10

## 2011-02-25 NOTE — ED Provider Notes (Signed)
Medical screening examination/treatment/procedure(s) were performed by non-physician practitioner and as supervising physician I was immediately available for consultation/collaboration.   Shelda Jakes, MD 02/25/11 1236

## 2011-02-25 NOTE — ED Provider Notes (Signed)
History     CSN: [id number] Arrival date & time: 02/25/2011 11:22 AM  Chief Complaint  Patient presents with  . Sore Throat    HPI  (Consider location/radiation/quality/duration/timing/severity/associated sxs/prior treatment)  HPI Comments: Pt states that she has a burning in her throat after eating and it fells like she is having some "acid in her mouth"  Patient is a 45 y.o. female presenting with pharyngitis. The history is provided by the patient.  Sore Throat This is a new problem. The current episode started yesterday. The problem occurs 2 to 4 times per day. Associated symptoms include fatigue and a sore throat. Pertinent negatives include no fever, headaches or swollen glands. The symptoms are aggravated by eating. She has tried nothing for the symptoms.    Past Medical History  Diagnosis Date  . Hypothyroid   . Anemia   . Hypertension     Past Surgical History  Procedure Date  . Gastric bypass   . Myomectomy     No family history on file.  History  Substance Use Topics  . Smoking status: Never Smoker   . Smokeless tobacco: Not on file  . Alcohol Use: Yes    OB History    Grav Para Term Preterm Abortions TAB SAB Ect Mult Living                  Review of Systems  Review of Systems  Constitutional: Positive for fatigue. Negative for fever.  HENT: Positive for sore throat. Negative for nosebleeds and rhinorrhea.   Respiratory: Negative for chest tightness and shortness of breath.   Neurological: Negative for headaches.  All other systems reviewed and are negative.    Allergies  Review of patient's allergies indicates not on file.  Home Medications   Current Outpatient Rx  Name Route Sig Dispense Refill  . SYNTHROID PO Oral Take by mouth.      . TRIAMTERENE-HCTZ 37.5-25 MG PO CAPS Oral Take 1 capsule by mouth every morning.        Physical Exam    BP 125/72  Pulse 80  Temp(Src) 99 F (37.2 C) (Oral)  Resp 18  SpO2 99%  Physical Exam    Nursing note and vitals reviewed. Constitutional: She is oriented to person, place, and time. She appears well-developed and well-nourished.  HENT:  Head: Normocephalic and atraumatic.  Right Ear: External ear normal.  Left Ear: External ear normal.  Mouth/Throat: Oropharynx is clear and moist.  Eyes: Conjunctivae are normal. Pupils are equal, round, and reactive to light.  Neck: Normal range of motion.  Cardiovascular: Normal rate and regular rhythm.   Pulmonary/Chest: Breath sounds normal.  Musculoskeletal: Normal range of motion.  Neurological: She is alert and oriented to person, place, and time.  Skin: Skin is dry.  Psychiatric: She has a normal mood and affect.    ED Course  Procedures (including critical care time)   Results for orders placed during the hospital encounter of 02/25/11  RAPID STREP SCREEN      Component Value Range   Streptococcus, Group A Screen (Direct) NEGATIVE  NEGATIVE      MDM Exam not and history not consistent with a strep:pt history is consistent with reflux:ill treat pt on out pt basis        Teressa Lower, NP 02/25/11 1230

## 2011-02-25 NOTE — ED Notes (Signed)
Patient states that yesterday, her throat started hurting, hurts to swallow, feels like it is on fire, feels fatigued

## 2011-02-27 ENCOUNTER — Emergency Department (INDEPENDENT_AMBULATORY_CARE_PROVIDER_SITE_OTHER): Payer: Managed Care, Other (non HMO)

## 2011-02-27 ENCOUNTER — Encounter (HOSPITAL_BASED_OUTPATIENT_CLINIC_OR_DEPARTMENT_OTHER): Payer: Self-pay | Admitting: *Deleted

## 2011-02-27 ENCOUNTER — Emergency Department (HOSPITAL_BASED_OUTPATIENT_CLINIC_OR_DEPARTMENT_OTHER)
Admission: EM | Admit: 2011-02-27 | Discharge: 2011-02-27 | Disposition: A | Discharge status: Home / Self Care | Payer: Managed Care, Other (non HMO) | Attending: Emergency Medicine | Admitting: Emergency Medicine

## 2011-02-27 DIAGNOSIS — I10 Essential (primary) hypertension: Secondary | ICD-10-CM | POA: Insufficient documentation

## 2011-02-27 DIAGNOSIS — IMO0002 Reserved for concepts with insufficient information to code with codable children: Secondary | ICD-10-CM

## 2011-02-27 DIAGNOSIS — M25569 Pain in unspecified knee: Secondary | ICD-10-CM

## 2011-02-27 DIAGNOSIS — M7989 Other specified soft tissue disorders: Secondary | ICD-10-CM | POA: Insufficient documentation

## 2011-02-27 DIAGNOSIS — R609 Edema, unspecified: Secondary | ICD-10-CM

## 2011-02-27 DIAGNOSIS — E039 Hypothyroidism, unspecified: Secondary | ICD-10-CM | POA: Insufficient documentation

## 2011-02-27 DIAGNOSIS — M1710 Unilateral primary osteoarthritis, unspecified knee: Secondary | ICD-10-CM

## 2011-02-27 DIAGNOSIS — M79609 Pain in unspecified limb: Secondary | ICD-10-CM

## 2011-02-27 NOTE — ED Notes (Signed)
MD at bedside speaking with pt about test results

## 2011-02-27 NOTE — ED Notes (Signed)
Pt here to have right leg evaluated states she was seen here on Friday but didn't mention it then and it isn't getting any better reports that her leg below her knee feels swollen to her also states her hip is " giving her trouble" pt denies any injury to leg. Denies shortness of breath, denies travel states she does sit at work.

## 2011-02-27 NOTE — ED Provider Notes (Signed)
History     CSN: [id number] Arrival date & time: 02/27/2011  9:17 AM  Chief Complaint  Patient presents with  . Leg Pain    HPI  (Consider location/radiation/quality/duration/timing/severity/associated sxs/prior treatment)  Patient is a 45 y.o. female presenting with leg pain. The history is provided by the patient.  Leg Pain  The incident occurred 2 days ago. There was no injury mechanism. The pain is present in the right knee. The quality of the pain is described as aching. The pain is moderate. The pain has been fluctuating since onset. Pertinent negatives include no numbness and no loss of sensation. The symptoms are aggravated by activity and palpation.  right knee and lower leg pain. Swelling into the lower leg compared to the left. No chest pain or dyspnea. No travel. No smoking. No trauma.  Past Medical History  Diagnosis Date  . Hypothyroid   . Anemia   . Hypertension     Past Surgical History  Procedure Date  . Gastric bypass   . Myomectomy     History reviewed. No pertinent family history.  History  Substance Use Topics  . Smoking status: Never Smoker   . Smokeless tobacco: Not on file  . Alcohol Use: Yes    OB History    Grav Para Term Preterm Abortions TAB SAB Ect Mult Living                  Review of Systems  Review of Systems  Constitutional: Negative for chills.  Respiratory: Negative for cough, choking, chest tightness and shortness of breath.   Cardiovascular: Positive for leg swelling. Negative for chest pain and palpitations.  Gastrointestinal: Negative for abdominal pain.  Genitourinary: Negative for difficulty urinating.  Musculoskeletal: Negative for back pain and joint swelling.  Skin: Negative for color change.  Neurological: Negative for numbness.    Allergies  Review of patient's allergies indicates no known allergies.  Home Medications   Current Outpatient Rx  Name Route Sig Dispense Refill  . SYNTHROID PO Oral Take by  mouth.      . TRIAMTERENE-HCTZ 37.5-25 MG PO CAPS Oral Take 1 capsule by mouth every morning.        Physical Exam    BP 123/67  Pulse 88  Temp(Src) 98.3 F (36.8 C) (Oral)  Resp 18  SpO2 97%  Physical Exam  Nursing note and vitals reviewed. Constitutional: She is oriented to person, place, and time. She appears well-developed and well-nourished.  HENT:  Head: Normocephalic and atraumatic.  Eyes: EOM are normal. Pupils are equal, round, and reactive to light.  Neck: Normal range of motion. Neck supple.  Cardiovascular: Normal rate, regular rhythm and normal heart sounds.   No murmur heard. Pulmonary/Chest: Effort normal and breath sounds normal. No respiratory distress. She has no wheezes. She has no rales.  Abdominal: Soft. Bowel sounds are normal. She exhibits no distension. There is no tenderness. There is no rebound and no guarding.  Musculoskeletal: Normal range of motion. She exhibits edema and tenderness.       Mild pain with movement of right knee. No effusion, but fullness posteriorly. Mild edema over lower leg. Dp pulse strong.   Neurological: She is alert and oriented to person, place, and time. No cranial nerve deficit.  Skin: Skin is warm and dry.  Psychiatric: She has a normal mood and affect. Her speech is normal.    ED Course  Procedures (including critical care time)  Labs Reviewed - No data to display  No results found. Results for orders placed during the hospital encounter of 02/25/11  RAPID STREP SCREEN      Component Value Range   Streptococcus, Group A Screen (Direct) NEGATIVE  NEGATIVE    Dg Knee 1-2 Views Right  02/27/2011  *RADIOLOGY REPORT*  Clinical Data: Posterior knee pain and swelling, no injury  RIGHT KNEE - 1-2 VIEW  Comparison: None.  Findings: There is primarily bicompartmental degenerative joint disease involving the medial and patellofemoral compartments. There is loss of joint space, sclerosis, and spurring.  No acute fracture is seen.   No effusion is noted.  IMPRESSION: Primarily bicompartmental degenerative joint disease.  No acute abnormality.  Original Report Authenticated By: Juline Patch, M.D.   US Venous Img Lower Unilateral Right  02/27/2011  *RADIOLOGY REPORT*  Clinical Data: Right calf pain.  RIGHT LOWER EXTREMITY VENOUS DUPLEX ULTRASOUND  Technique: Gray-scale sonography with graded compression, as well as color Doppler and duplex ultrasound, were performed to evaluate the deep venous system of the right lower extremity from the level of the common femoral vein through the popliteal and proximal calf veins. Spectral Doppler was utilized to evaluate flow at rest and with distal augmentation maneuvers.  Comparison: None.  Findings:  Normal compressibility of  the right common femoral, superficial femoral, and popliteal veins is demonstrated, as well as the visualized proximal calf veins.  No filling defects to suggest DVT on grayscale or color Doppler imaging.  Doppler waveforms show normal direction of venous flow, normal respiratory phasicity and response to augmentation.  IMPRESSION: No evidence of  right lower extremity deep vein thrombosis.  Original Report Authenticated By: Darrol Angel, M.D.     No diagnosis found.   MDM Lower leg and knee pain. Arthritis on xray. Negative doppler. Discharged.        Juliet Rude. Rubin Payor, MD 02/27/11 1056

## 2011-03-10 LAB — URINALYSIS, ROUTINE W REFLEX MICROSCOPIC
Glucose, UA: NEGATIVE mg/dL
Nitrite: NEGATIVE
Protein, ur: NEGATIVE mg/dL
pH: 6 (ref 5.0–8.0)

## 2011-03-10 LAB — WET PREP, GENITAL
Trich, Wet Prep: NONE SEEN
Yeast Wet Prep HPF POC: NONE SEEN

## 2011-03-10 LAB — GC/CHLAMYDIA PROBE AMP, GENITAL: GC Probe Amp, Genital: NEGATIVE

## 2011-04-02 ENCOUNTER — Encounter (HOSPITAL_BASED_OUTPATIENT_CLINIC_OR_DEPARTMENT_OTHER): Payer: Self-pay | Admitting: Emergency Medicine

## 2011-04-02 ENCOUNTER — Emergency Department (INDEPENDENT_AMBULATORY_CARE_PROVIDER_SITE_OTHER): Payer: Managed Care, Other (non HMO)

## 2011-04-02 ENCOUNTER — Emergency Department (HOSPITAL_BASED_OUTPATIENT_CLINIC_OR_DEPARTMENT_OTHER)
Admission: EM | Admit: 2011-04-02 | Discharge: 2011-04-02 | Disposition: A | Discharge status: Home / Self Care | Payer: Managed Care, Other (non HMO) | Attending: Emergency Medicine | Admitting: Emergency Medicine

## 2011-04-02 DIAGNOSIS — R0602 Shortness of breath: Secondary | ICD-10-CM | POA: Insufficient documentation

## 2011-04-02 DIAGNOSIS — J40 Bronchitis, not specified as acute or chronic: Secondary | ICD-10-CM

## 2011-04-02 DIAGNOSIS — B9789 Other viral agents as the cause of diseases classified elsewhere: Secondary | ICD-10-CM | POA: Insufficient documentation

## 2011-04-02 DIAGNOSIS — R05 Cough: Secondary | ICD-10-CM

## 2011-04-02 DIAGNOSIS — R059 Cough, unspecified: Secondary | ICD-10-CM

## 2011-04-02 DIAGNOSIS — B349 Viral infection, unspecified: Secondary | ICD-10-CM

## 2011-04-02 DIAGNOSIS — J040 Acute laryngitis: Secondary | ICD-10-CM | POA: Insufficient documentation

## 2011-04-02 MED ORDER — ALBUTEROL SULFATE HFA 108 (90 BASE) MCG/ACT IN AERS
2.0000 | INHALATION_SPRAY | Freq: Four times a day (QID) | RESPIRATORY_TRACT | Status: DC
  Administered 2011-04-02: 2 via RESPIRATORY_TRACT
  Filled 2011-04-02: qty 6.7

## 2011-04-02 NOTE — ED Notes (Signed)
Pt started with runny nose, sore throat and cough since Wednesday.   Has progressed to laryngitis and woke up today with some SOB.  No acute resp distress.

## 2011-04-02 NOTE — ED Provider Notes (Signed)
History     CSN: [id number] Arrival date & time: 04/02/2011  8:03 AM   First MD Initiated Contact with Patient 04/02/11 [phone]      Chief Complaint  Patient presents with  . Sore Throat  . Nasal Congestion  . Cough  . Shortness of Breath    (Consider location/radiation/quality/duration/timing/severity/associated sxs/prior treatment) HPI Patient presents with complaint of cough nasal congestion and laryngitis. She states symptoms began approximately 4 days ago. She did have fever that first day but none sense. Her cough is nonproductive. She lost her voice yesterday. States she has felt generally fatigued. No rashes no difficulty swallowing or breathing no chest pain, no leg swelling, no nausea or vomiting or diarrhea or abdominal pain. She has not tried anything to make symptoms better. There are no other associated symptoms.  Past Medical History  Diagnosis Date  . Hypothyroid   . Anemia   . Hypertension     Past Surgical History  Procedure Date  . Gastric bypass   . Myomectomy     History reviewed. No pertinent family history.  History  Substance Use Topics  . Smoking status: Never Smoker   . Smokeless tobacco: Not on file  . Alcohol Use: No    OB History    Grav Para Term Preterm Abortions TAB SAB Ect Mult Living                  Review of Systems ROS reviewed and otherwise negative except for mentioned in HPI  Allergies  Review of patient's allergies indicates no known allergies.  Home Medications   Current Outpatient Rx  Name Route Sig Dispense Refill  . SYNTHROID PO Oral Take by mouth.      . TRIAMTERENE-HCTZ 37.5-25 MG PO CAPS Oral Take 1 capsule by mouth every morning.        BP 128/62  Pulse 94  Temp(Src) 98 F (36.7 C) (Oral)  Resp 18  Ht 5\' 7"  (1.702 m)  Wt 245 lb (111.131 kg)  BMI 38.37 kg/m2  SpO2 100%  LMP 03/22/2011 Vitals reviewed Physical Exam Physical Examination: General appearance - alert, well appearing, and in no  distress Mental status - alert, oriented to person, place, and time Mouth - mucous membranes moist, pharynx normal without lesions Neck - shotty cervical LAD Lymphatics - no palpable lymphadenopathy Chest - clear to auscultation, no wheezes, rales or rhonchi, symmetric air entry Heart - normal rate, regular rhythm, normal S1, S2, no murmurs, rubs, clicks or gallops Abdomen - soft, nontender, nondistended, no masses or organomegaly Neurological - sensory and motor exams both grossly normal Musculoskeletal - no joint tenderness, deformity or swelling Extremities - peripheral pulses normal, no pedal edema, no clubbing or cyanosis Skin - normal coloration and turgor, no rashes, no suspicious skin lesions noted  ED Course  Procedures (including critical care time)  Labs Reviewed - No data to display Dg Chest 2 View  04/02/2011  *RADIOLOGY REPORT*  Clinical Data: Cough  CHEST - 2 VIEW  Comparison: 04/01/2010  Findings: Vascular clips in the left upper abdomen. Lungs clear. Heart size and pulmonary vascularity normal.  No effusion. Visualized bones unremarkable.  IMPRESSION: No acute disease  Original Report Authenticated By: Thora Lance III, M.D.     1. Laryngitis   2. Bronchitis   3. Viral syndrome       MDM  The patient lives symptoms of nonproductive cough worse in his voice and generalized fatigue. Chest x-ray without any acute findings. Patient  is nontoxic-appearing, well-hydrated, and in no distress. I suspect a viral illness and possible bronchitis. Patient given albuterol MDI to help with cough. Discharged with strict return precautions and patient is agreeable with this plan.        Ethelda Chick, MD 04/02/11 1000

## 2011-08-09 ENCOUNTER — Other Ambulatory Visit (HOSPITAL_COMMUNITY)
Admission: RE | Admit: 2011-08-09 | Discharge: 2011-08-09 | Disposition: A | Discharge status: Home / Self Care | Payer: Managed Care, Other (non HMO) | Source: Ambulatory Visit | Attending: Obstetrics and Gynecology | Admitting: Obstetrics and Gynecology

## 2011-08-09 ENCOUNTER — Other Ambulatory Visit: Payer: Self-pay | Admitting: Obstetrics and Gynecology

## 2011-08-09 DIAGNOSIS — Z01419 Encounter for gynecological examination (general) (routine) without abnormal findings: Secondary | ICD-10-CM | POA: Insufficient documentation

## 2012-08-05 IMAGING — US US EXTREM LOW VENOUS*R*
1 series · 14 of 18 positions shown · non-contrast
Comparison: None.

CLINICAL DATA: Right calf pain.



[Series 1: us extrem low venous*right* · 14 of 18 slices shown]
[im 1/18]
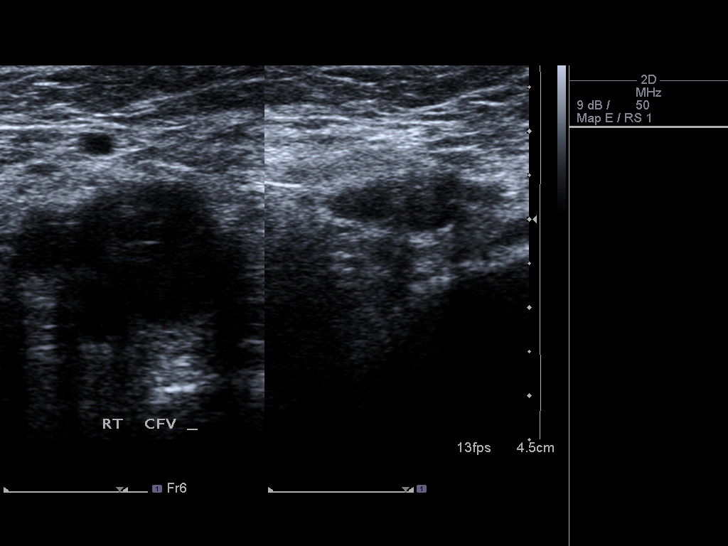
[im 2/18]
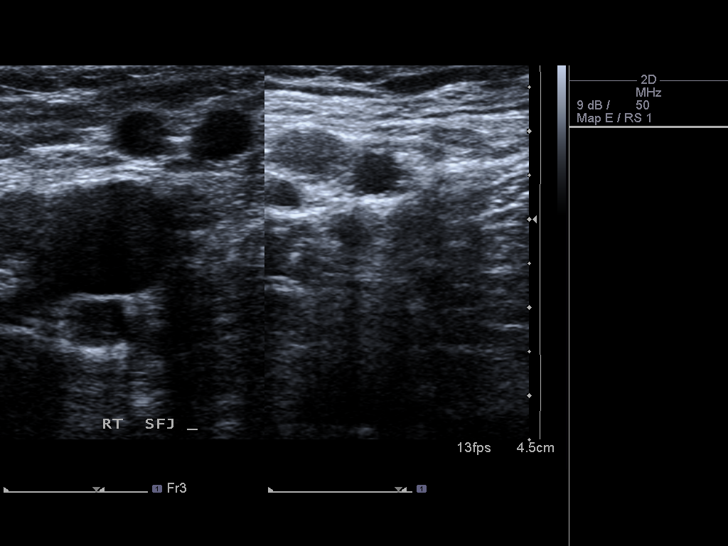
[im 4/18]
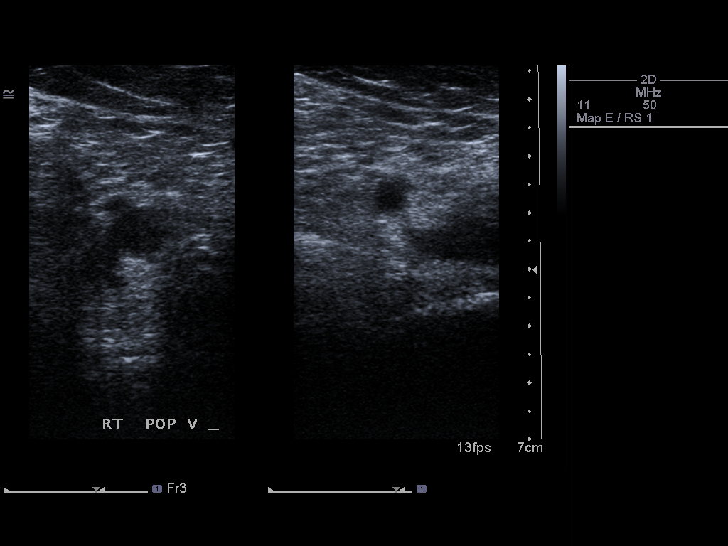
[im 5/18]
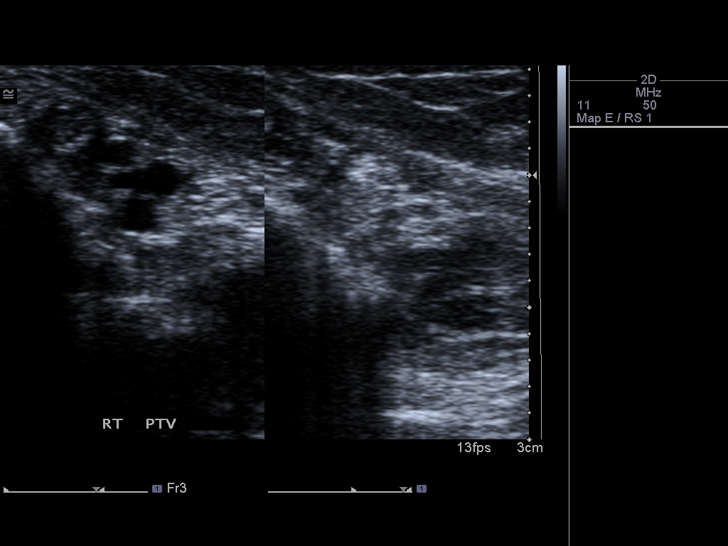
[im 6/18]
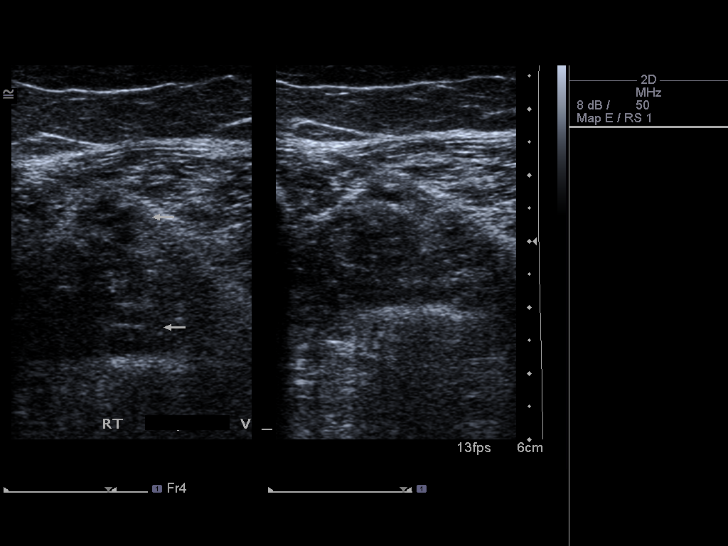
[im 8/18]
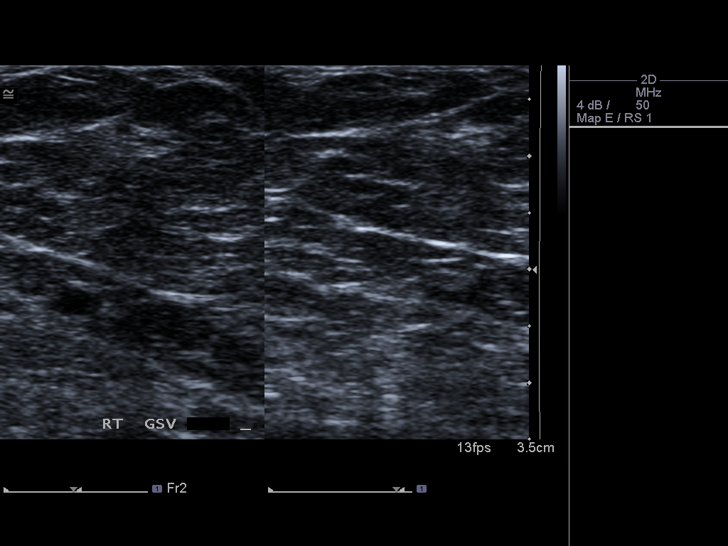
[im 9/18]
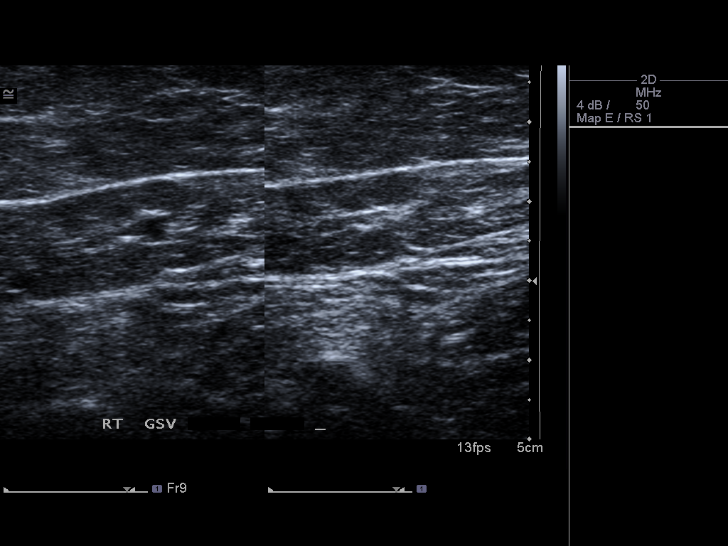
[im 10/18]
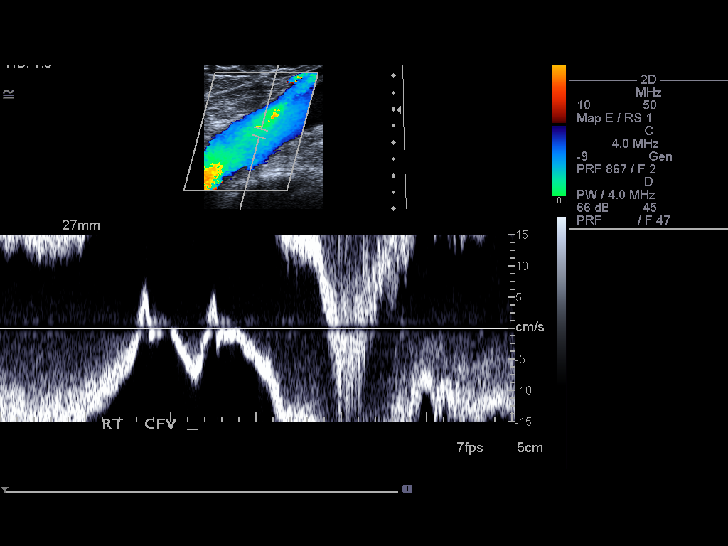
[im 11/18]
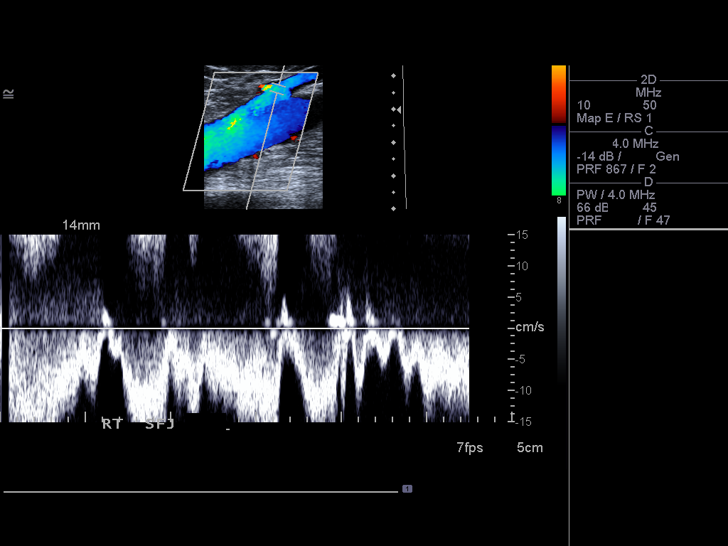
[im 13/18]
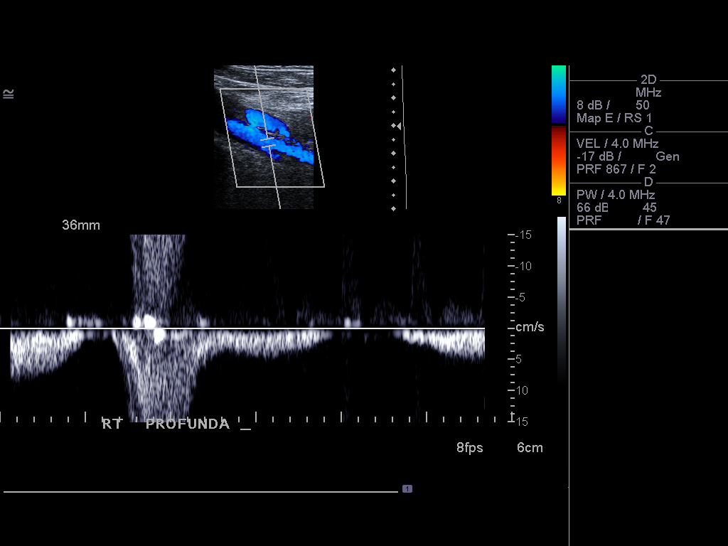
[im 14/18]
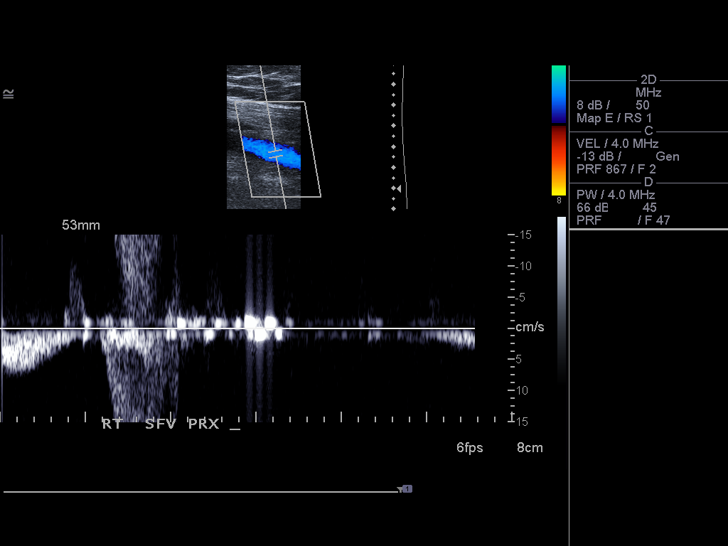
[im 15/18]
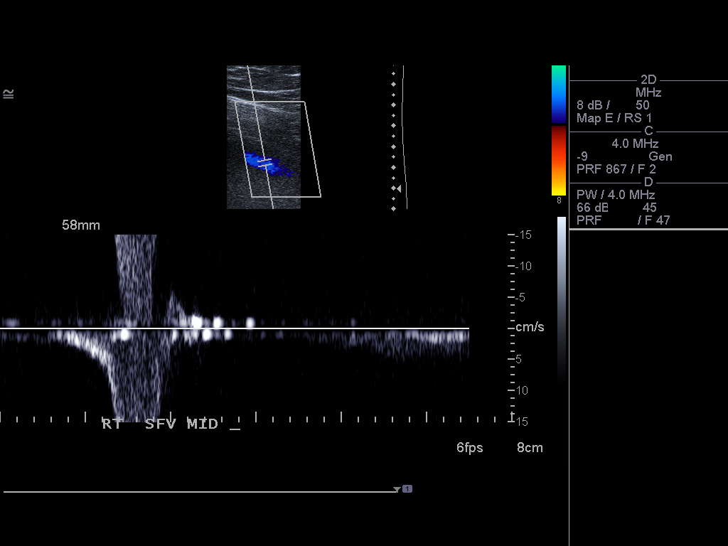
[im 17/18]
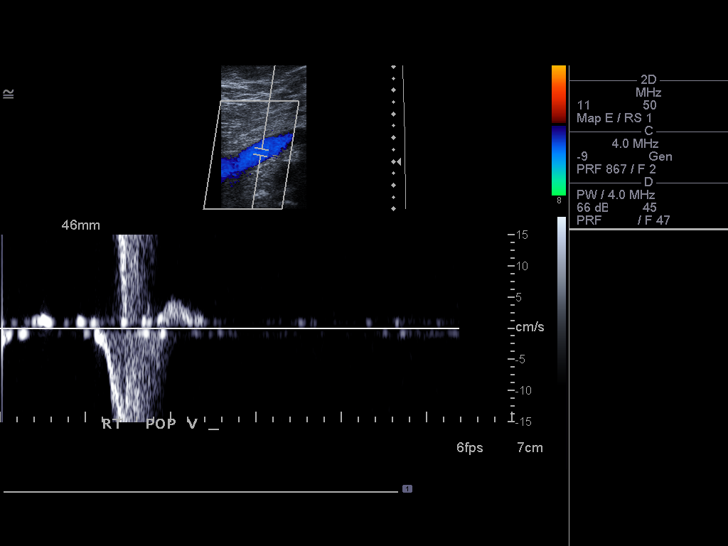
[im 18/18]
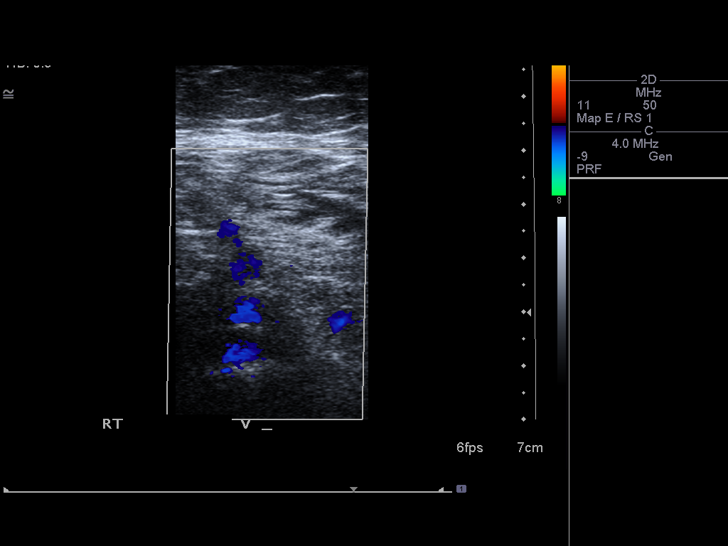

[14 of 18 positions shown; findings below may reference images not displayed]

FINDINGS: Normal compressibility of  the right common femoral,
superficial femoral, and popliteal veins is demonstrated, as well
as the visualized proximal calf veins.  No filling defects to
suggest DVT on grayscale or color Doppler imaging.  Doppler
waveforms show normal direction of venous flow, normal respiratory
phasicity and response to augmentation.
IMPRESSION: No evidence of  right lower extremity deep vein thrombosis.

## 2012-08-05 IMAGING — CR DG KNEE 1-2V*R*
2 series · 2 of 2 positions shown · non-contrast
Comparison: None.

CLINICAL DATA: Posterior knee pain and swelling, no injury

RIGHT KNEE - 1-2 VIEW

[t knee ap right (1 of 2)]
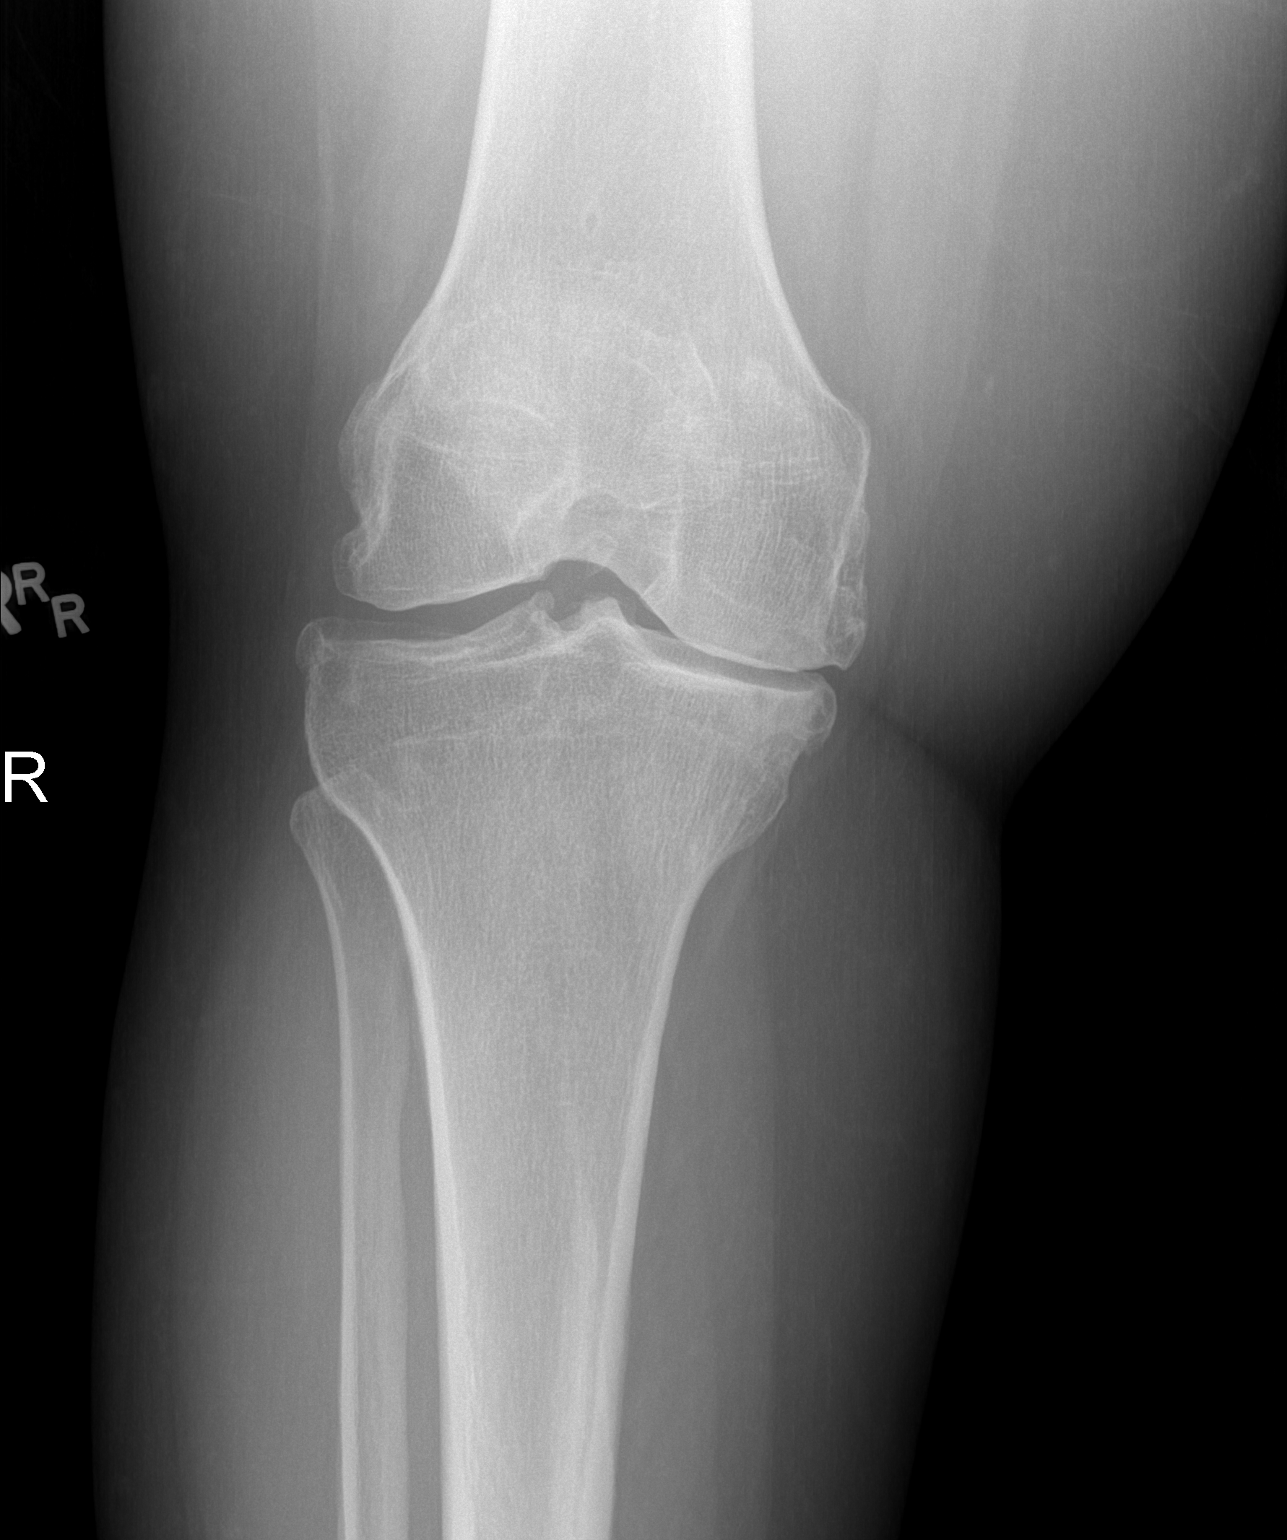

[t knee ap right (2 of 2)]
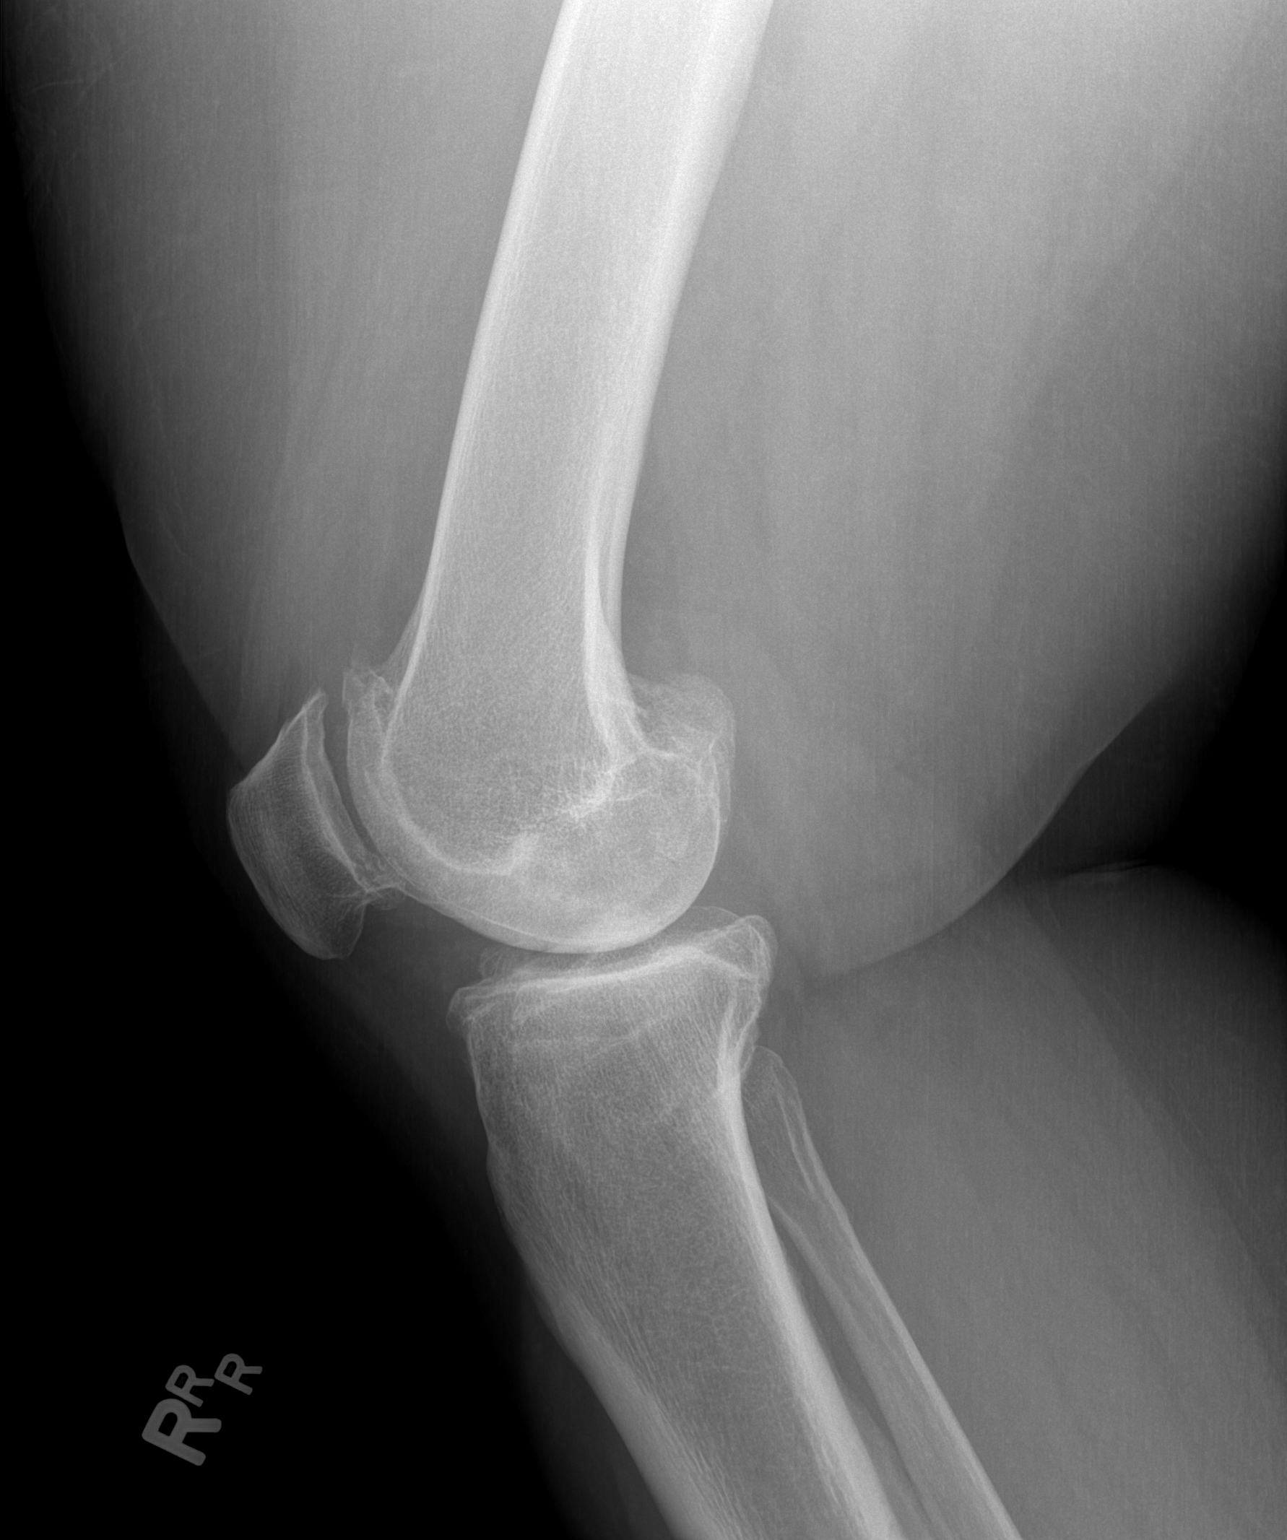

[2 of 2 positions shown; findings below may reference images not displayed]

FINDINGS: There is primarily bicompartmental degenerative joint
disease involving the medial and patellofemoral compartments.
There is loss of joint space, sclerosis, and spurring.  No acute
fracture is seen.  No effusion is noted.
IMPRESSION: Primarily bicompartmental degenerative joint disease.  No acute
abnormality.

## 2012-11-30 ENCOUNTER — Emergency Department (HOSPITAL_BASED_OUTPATIENT_CLINIC_OR_DEPARTMENT_OTHER): Payer: 59

## 2012-11-30 ENCOUNTER — Encounter (HOSPITAL_BASED_OUTPATIENT_CLINIC_OR_DEPARTMENT_OTHER): Payer: Self-pay | Admitting: *Deleted

## 2012-11-30 ENCOUNTER — Emergency Department (HOSPITAL_BASED_OUTPATIENT_CLINIC_OR_DEPARTMENT_OTHER)
Admission: EM | Admit: 2012-11-30 | Discharge: 2012-11-30 | Disposition: A | Discharge status: Home / Self Care | Payer: 59 | Attending: Emergency Medicine | Admitting: Emergency Medicine

## 2012-11-30 DIAGNOSIS — M25569 Pain in unspecified knee: Secondary | ICD-10-CM | POA: Insufficient documentation

## 2012-11-30 DIAGNOSIS — I10 Essential (primary) hypertension: Secondary | ICD-10-CM | POA: Insufficient documentation

## 2012-11-30 DIAGNOSIS — M25561 Pain in right knee: Secondary | ICD-10-CM

## 2012-11-30 DIAGNOSIS — E039 Hypothyroidism, unspecified: Secondary | ICD-10-CM | POA: Insufficient documentation

## 2012-11-30 DIAGNOSIS — Z862 Personal history of diseases of the blood and blood-forming organs and certain disorders involving the immune mechanism: Secondary | ICD-10-CM | POA: Insufficient documentation

## 2012-11-30 MED ORDER — TRAMADOL HCL 50 MG PO TABS: 50.0000 mg | ORAL_TABLET | Freq: Four times a day (QID) | ORAL | Status: DC | PRN

## 2012-11-30 MED ORDER — TRAMADOL HCL 50 MG PO TABS
50.0000 mg | ORAL_TABLET | Freq: Once | ORAL | Status: AC
  Administered 2012-11-30: 50 mg via ORAL
  Filled 2012-11-30: qty 1

## 2012-11-30 NOTE — ED Notes (Signed)
Right knee pain pain for appx a week. States that at times the pain radiates from her hip and that she feel s a stabbing pain in her knee. Also states that it is swollen

## 2012-12-09 NOTE — ED Provider Notes (Signed)
History     47yF with R knee pain. Gradual onset over a week ago. Atraumatic. Pain sometimes radiates up into her R hip. He is at rest. Worse with ambulation and movement. No numbness, tingling or loss of strength. NO fever or chills. No other joint pain. No intervention prior to arrival.  CSN: [id number] Arrival date & time 11/30/12  9562  First MD Initiated Contact with Patient 11/30/12 [phone]     Chief Complaint  Patient presents with  . Knee Pain   (Consider location/radiation/quality/duration/timing/severity/associated sxs/prior Treatment) HPI Past Medical History  Diagnosis Date  . Hypothyroid   . Anemia   . Hypertension    Past Surgical History  Procedure Laterality Date  . Gastric bypass    . Myomectomy     No family history on file. History  Substance Use Topics  . Smoking status: Never Smoker   . Smokeless tobacco: Not on file  . Alcohol Use: No   OB History   Grav Para Term Preterm Abortions TAB SAB Ect Mult Living                 Review of Systems  All systems reviewed and negative, other than as noted in HPI.   Allergies  Ibuprofen  Home Medications   Current Outpatient Rx  Name  Route  Sig  Dispense  Refill  . Levothyroxine Sodium (SYNTHROID PO)   Oral   Take by mouth.           . traMADol (ULTRAM) 50 MG tablet   Oral   Take 1 tablet (50 mg total) by mouth every 6 (six) hours as needed for pain.   15 tablet   0   . triamterene-hydrochlorothiazide (DYAZIDE) 37.5-25 MG per capsule   Oral   Take 1 capsule by mouth every morning.            BP 132/78  Pulse 84  Temp(Src) 99 F (37.2 C) (Oral)  Resp 20  Ht 5\' 6"  (1.676 m)  Wt 219 lb (99.338 kg)  BMI 35.36 kg/m2  SpO2 97% Physical Exam  Nursing note and vitals reviewed. Constitutional: She appears well-developed and well-nourished. No distress.  HENT:  Head: Normocephalic and atraumatic.  Eyes: Conjunctivae are normal. Right eye exhibits no discharge. Left eye exhibits no  discharge.  Neck: Neck supple.  Cardiovascular: Normal rate, regular rhythm and normal heart sounds.  Exam reveals no gallop and no friction rub.   No murmur heard. Pulmonary/Chest: Effort normal and breath sounds normal. No respiratory distress.  Abdominal: Soft. She exhibits no distension. There is no tenderness.  Musculoskeletal: She exhibits no edema and no tenderness.  R knee symmetric as compared to L. No effusion. No tenderness to palpation. Crepitus with ROM. ROM of R hip w/o pain actively and passively. NVI distally.    Neurological: She is alert.  Skin: Skin is warm and dry.  Psychiatric: She has a normal mood and affect. Her behavior is normal. Thought content normal.    ED Course  Procedures (including critical care time) Labs Reviewed - No data to display No results found.  Dg Knee 1-2 Views Right  11/30/2012   *RADIOLOGY REPORT*  Clinical Data: Knee pain.  RIGHT KNEE - 1-2 VIEW  Comparison: Two-view radiographs 02/27/2011  Findings: Moderate degenerative changes of the right knee are as stable.  No acute bone or soft tissue abnormality is present. There is no significant effusion.  IMPRESSION:  1.  Similar appearance of moderate degenerative changes  of the knee, most evident in the medial and patellofemoral compartments. 2.  No acute abnormality.   Original Report Authenticated By: Marin Roberts, M.D.  1. Knee pain, right     MDM  47 year old female with right knee pain. Atraumatic. Suspect osteoarthritis. Plan Sinemet treatment. Outpatient followup.  Raeford Razor, MD 12/09/12 [phone]

## 2013-04-19 ENCOUNTER — Emergency Department (HOSPITAL_COMMUNITY)
Admission: EM | Admit: 2013-04-19 | Discharge: 2013-04-19 | Disposition: A | Discharge status: Home / Self Care | Payer: 59 | Source: Home / Self Care

## 2013-04-19 NOTE — ED Notes (Signed)
Pt elected to leave the facility before being seen by a physician.

## 2013-04-21 ENCOUNTER — Emergency Department (INDEPENDENT_AMBULATORY_CARE_PROVIDER_SITE_OTHER): Payer: 59

## 2013-04-21 ENCOUNTER — Encounter: Payer: Self-pay | Admitting: Emergency Medicine

## 2013-04-21 ENCOUNTER — Emergency Department
Admission: EM | Admit: 2013-04-21 | Discharge: 2013-04-21 | Disposition: A | Discharge status: Home / Self Care | Payer: 59 | Source: Home / Self Care | Attending: Family Medicine | Admitting: Family Medicine

## 2013-04-21 ENCOUNTER — Ambulatory Visit (INDEPENDENT_AMBULATORY_CARE_PROVIDER_SITE_OTHER): Payer: 59 | Admitting: Sports Medicine

## 2013-04-21 DIAGNOSIS — M25561 Pain in right knee: Secondary | ICD-10-CM

## 2013-04-21 DIAGNOSIS — M1710 Unilateral primary osteoarthritis, unspecified knee: Secondary | ICD-10-CM

## 2013-04-21 DIAGNOSIS — M25569 Pain in unspecified knee: Secondary | ICD-10-CM

## 2013-04-21 DIAGNOSIS — K219 Gastro-esophageal reflux disease without esophagitis: Secondary | ICD-10-CM

## 2013-04-21 DIAGNOSIS — R0989 Other specified symptoms and signs involving the circulatory and respiratory systems: Secondary | ICD-10-CM

## 2013-04-21 DIAGNOSIS — M170 Bilateral primary osteoarthritis of knee: Secondary | ICD-10-CM | POA: Insufficient documentation

## 2013-04-21 DIAGNOSIS — M1711 Unilateral primary osteoarthritis, right knee: Secondary | ICD-10-CM

## 2013-04-21 DIAGNOSIS — IMO0002 Reserved for concepts with insufficient information to code with codable children: Secondary | ICD-10-CM

## 2013-04-21 DIAGNOSIS — R05 Cough: Secondary | ICD-10-CM

## 2013-04-21 DIAGNOSIS — R059 Cough, unspecified: Secondary | ICD-10-CM

## 2013-04-21 MED ORDER — OMEPRAZOLE 20 MG PO CPDR: 20.0000 mg | DELAYED_RELEASE_CAPSULE | Freq: Every day | ORAL | Status: DC

## 2013-04-21 NOTE — Progress Notes (Signed)
   Subjective:    I'm seeing this patient as a consultation for:  Dr. 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 Unfortunately earlier today she was getting out of the shower, bend her knee, felt a pop, and had immediate pain and swelling that she localized along the medial joint line. Pain was severe, persistent, no radiation, worse with weightbearing. She was unable to fully extend the knee after the injury. I was called for further evaluation and definitive management.  Past medical history, Surgical history, Family history not pertinant except as noted below, Social history, Allergies, and medications have been entered into the medical record, reviewed, and no changes needed.   Review of Systems: No headache, visual changes, nausea, vomiting, diarrhea, constipation, dizziness, abdominal pain, skin rash, fevers, chills, night sweats, weight loss, swollen lymph nodes, body aches, joint swelling, muscle aches, chest pain, shortness of breath, mood changes, visual or auditory hallucinations.   Objective:   General: Well Developed, well nourished, and in no acute distress.  Neuro/Psych: Alert and oriented x3, extra-ocular muscles intact, able to move all 4 extremities, sensation grossly intact. Skin: Warm and dry, no rashes noted.  Respiratory: Not using accessory muscles, speaking in full sentences, trachea midline.  Cardiovascular: Pulses palpable, no extremity edema. Abdomen: Does not appear distended. Right Knee: Visible effusion, tender to palpation at the medial joint line. ROM full in flexion and extension and lower leg rotation. Ligaments with solid consistent endpoints including ACL, PCL, LCL, MCL. Non painful patellar compression. Patellar glide without crepitus. Patellar and quadriceps tendons unremarkable. Hamstring and quadriceps strength is normal.   X-rays  were reviewed and show bone-on-bone medial compartmental DJD.  Procedure: Real-time Ultrasound Guided Injection of right knee Device: GE Logiq E  Verbal informed consent obtained.  Time-out conducted.  Noted no overlying erythema, induration, or other signs of local infection.  Skin prepped in a sterile fashion.  Local anesthesia: Topical Ethyl chloride.  With sterile technique and under real time ultrasound guidance:  25-gauge needle advanced into the suprapatellar recess, 2 cc Kenalog 40, 4 cc lidocaine injected easily. Completed without difficulty  Pain immediately resolved suggesting accurate placement of the medication.  Advised to call if fevers/chills, erythema, induration, drainage, or persistent bleeding.  Images permanently stored and available for review in the ultrasound unit.  Impression: Technically successful ultrasound guided injection.  Impression and Recommendations:   This case required medical decision making of moderate complexity.

## 2013-04-21 NOTE — ED Notes (Signed)
[name] complains of stabbing right knee pain today. The pain is a 9/10 on the pain scale. Denies injury.   She also reports chest tightness, productive cough with clear sputum and wheezing for 1.5 months. Denies fever, chills or sweats.

## 2013-04-21 NOTE — Assessment & Plan Note (Signed)
Injection as above. Return to see me in 4 weeks to see how things are going. She likely has a flare of her osteoarthritis as well as degenerative meniscal tear.

## 2013-04-21 NOTE — ED Provider Notes (Signed)
CSN: [id number]     Arrival date & time 04/21/13  5784 History   First MD Initiated Contact with Patient 04/21/13 1023     Chief Complaint  Patient presents with  . Knee Pain    right knee pain this am  . Cough    1.5 months  . Wheezing    1.5 months    HPI Comments: While stepping out of the shower this morning, she twisted her right knee and experienced stabbing pain and sensation of locking in the knee. She also complains of a 6 week history of non-productive persistent cough, worse at night.  She wheezes occasionally, and the cough sometimes awakens her.  No fevers, chills, and sweats. She has a past history of gastric bypass.  She states that she often has indigestion, worse at night.     Past Medical History  Diagnosis Date  . Hypothyroid   . Anemia   . Hypertension    Past Surgical History  Procedure Laterality Date  . Gastric bypass    . Myomectomy     Family History  Problem Relation Age of Onset  . Hypertension Mother   . Hypertension Father    History  Substance Use Topics  . Smoking status: Never Smoker   . Smokeless tobacco: Not on file  . Alcohol Use: No   OB History   Grav Para Term Preterm Abortions TAB SAB Ect Mult Living                 Review of Systems No sore throat + cough No pleuritic pain + wheezing No nasal congestion No post-nasal drainage No sinus pain/pressure No itchy/red eyes No earache No hemoptysis No SOB No fever/chills No nausea No vomiting No abdominal pain No diarrhea No urinary symptoms No skin rash + fatigue No myalgias No headache + right knee pain Used OTC meds without relief  Allergies  Ibuprofen and Prednisone  Home Medications   Current Outpatient Rx  Name  Route  Sig  Dispense  Refill  . Levothyroxine Sodium (SYNTHROID PO)   Oral   Take by mouth.           . triamterene-hydrochlorothiazide (DYAZIDE) 37.5-25 MG per capsule   Oral   Take 1 capsule by mouth every morning.           Marland Kitchen  omeprazole (PRILOSEC) 20 MG capsule   Oral   Take 1 capsule (20 mg total) by mouth daily. Take 30 minutes before evening meal   30 capsule   1   . traMADol (ULTRAM) 50 MG tablet   Oral   Take 1 tablet (50 mg total) by mouth every 6 (six) hours as needed for pain.   15 tablet   0    BP 111/73  Pulse 95  Temp(Src) 97.9 F (36.6 C) (Oral)  Ht 5\' 6"  (1.676 m)  Wt 208 lb (94.348 kg)  BMI 33.59 kg/m2  SpO2 99%  LMP 03/05/2013 Physical Exam Nursing notes and Vital Signs reviewed. Appearance:  Patient appears stated age, and in no acute distress.  Patient is obese (BMI 33.6) Eyes:  Pupils are equal, round, and reactive to light and accomodation.  Extraocular movement is intact.  Conjunctivae are not inflamed  Nose:   Normal  Pharynx:  Normal Neck:  Supple.  No adenopathy Lungs:  Clear to auscultation.  Breath sounds are equal.  Heart:  Regular rate and rhythm without murmurs, rubs, or gallops.  Abdomen:  Nontender without masses or hepatosplenomegaly.  Bowel sounds are present.  No CVA or flank tenderness.  Extremities:  No edema.   Right knee:  Decreased range of motion; she has difficulty achieving full extension.  Joint stable.  Tenderness over medial joint line.  No effusion Skin:  No rash present.   ED Course  Procedures none     Imaging Review Dg Chest 2 View  04/21/2013   CLINICAL DATA:  Cough and congestion  EXAM: CHEST  2 VIEW  COMPARISON:  April 02, 2011  FINDINGS: Lungs are clear. Heart size and pulmonary vascularity are normal. No adenopathy. No bone lesions. There is an apparent lap band near the gastric cardia.  IMPRESSION: No edema or consolidation.   Electronically Signed   By: Bretta Bang M.D.   On: 04/21/2013 11:38   Dg Knee Complete 4 Views Right  04/21/2013   CLINICAL DATA:  Pain  EXAM: RIGHT KNEE - COMPLETE 4+ VIEW  COMPARISON:  November 30, 2012  FINDINGS: Frontal, lateral, and bilateral oblique views were obtained. There is no fracture, dislocation, or  effusion. There is marked narrowing medially which has progressed. There is moderately severe narrowing of the patellofemoral joint. There is spurring in all compartments. No erosive change.  IMPRESSION: Extensive osteoarthritic change with progression of narrowing medially compared to prior study. No fracture or effusion.   Electronically Signed   By: Bretta Bang M.D.   On: 04/21/2013 11:44      MDM   1. GERD (gastroesophageal reflux disease)   2. Right medial knee pain; suspect combination of DJD and medial meniscus injury.   Begin Omeprazole 20mg  QPM.  Discussed reflux precautions. Followup with GI as scheduled in December.  Followup with Dr. Rodney Langton for continued management of right knee pain.    Lattie Haw, MD 04/25/13 2159

## 2013-04-27 ENCOUNTER — Telehealth: Payer: Self-pay | Admitting: Emergency Medicine

## 2013-05-19 ENCOUNTER — Encounter: Payer: Self-pay | Admitting: Sports Medicine

## 2013-05-19 ENCOUNTER — Ambulatory Visit (INDEPENDENT_AMBULATORY_CARE_PROVIDER_SITE_OTHER): Payer: 59 | Admitting: Sports Medicine

## 2013-05-19 VITALS — BP 120/77 | HR 83 | Wt 214.0 lb

## 2013-05-19 DIAGNOSIS — M170 Bilateral primary osteoarthritis of knee: Secondary | ICD-10-CM

## 2013-05-19 DIAGNOSIS — M1710 Unilateral primary osteoarthritis, unspecified knee: Secondary | ICD-10-CM

## 2013-05-19 MED ORDER — TRAMADOL HCL 50 MG PO TABS: 50.0000 mg | ORAL_TABLET | Freq: Three times a day (TID) | ORAL | Status: DC | PRN

## 2013-05-19 MED ORDER — DIAZEPAM 5 MG PO TABS: ORAL_TABLET | ORAL | Status: DC

## 2013-05-19 NOTE — Progress Notes (Signed)
  Subjective:    CC: Recheck knee pain  HPI: Bilateral knee osteoarthritis: Injected right knee in urgent care approximately one month ago. She had a fantastic response, and does have a recurrence of pain but nor near as bad as it was. She also has pain in her left knee and desires repeat interventional treatment. Pain is localized to both medial joint line, moderate, persistent, worse with ambulation and weightbearing, minimal swelling.  Past medical history, Surgical history, Family history not pertinant except as noted below, Social history, Allergies, and medications have been entered into the medical record, reviewed, and no changes needed.   Review of Systems: No fevers, chills, night sweats, weight loss, chest pain, or shortness of breath.   Objective:    General: Well Developed, well nourished, and in no acute distress.  Neuro: Alert and oriented x3, extra-ocular muscles intact, sensation grossly intact.  HEENT: Normocephalic, atraumatic, pupils equal round reactive to light, neck supple, no masses, no lymphadenopathy, thyroid nonpalpable.  Skin: Warm and dry, no rashes. Cardiac: Regular rate and rhythm, no murmurs rubs or gallops, no lower extremity edema.  Respiratory: Clear to auscultation bilaterally. Not using accessory muscles, speaking in full sentences. Bilateral knees: Only minimal swelling visible, no palpable effusion, tender to palpation at the medial joint line bilaterally. ROM full in flexion and extension and lower leg rotation. Ligaments with solid consistent endpoints including ACL, PCL, LCL, MCL. Negative Mcmurray's, Apley's, and Thessalonian tests. Non painful patellar compression. Patellar glide without crepitus. Patellar and quadriceps tendons unremarkable. Hamstring and quadriceps strength is normal.   Procedure: Real-time Ultrasound Guided Injection of left knee Device: GE Logiq E  Verbal informed consent obtained.  Time-out conducted.  Noted no  overlying erythema, induration, or other signs of local infection.  Skin prepped in a sterile fashion.  Local anesthesia: Topical Ethyl chloride.  With sterile technique and under real time ultrasound guidance:  2 cc Kenalog 40, 4 cc lidocaine injected easily into the suprapatellar recess. Completed without difficulty  Pain immediately resolved suggesting accurate placement of the medication.  Advised to call if fevers/chills, erythema, induration, drainage, or persistent bleeding.  Images permanently stored and available for review in the ultrasound unit.  Impression: Technically successful ultrasound guided injection.  Procedure: Real-time Ultrasound Guided Injection of right knee Device: GE Logiq E  Verbal informed consent obtained.  Time-out conducted.  Noted no overlying erythema, induration, or other signs of local infection.  Skin prepped in a sterile fashion.  Local anesthesia: Topical Ethyl chloride.  With sterile technique and under real time ultrasound guidance:  2 cc Kenalog 40, 4 cc lidocaine injected easily into the suprapatellar recess. Completed without difficulty  Pain immediately resolved suggesting accurate placement of the medication.  Advised to call if fevers/chills, erythema, induration, drainage, or persistent bleeding.  Images permanently stored and available for review in the ultrasound unit.  Impression: Technically successful ultrasound guided injection.  Impression and Recommendations:

## 2013-05-19 NOTE — Assessment & Plan Note (Signed)
Bilateral injection as above. We are going to get Visco supplementation approved. Tramadol as needed for pain. Return in one month.

## 2013-06-16 ENCOUNTER — Ambulatory Visit (INDEPENDENT_AMBULATORY_CARE_PROVIDER_SITE_OTHER): Payer: 59 | Admitting: Sports Medicine

## 2013-06-16 ENCOUNTER — Encounter: Payer: Self-pay | Admitting: Sports Medicine

## 2013-06-16 VITALS — BP 124/79 | HR 81 | Wt 218.0 lb

## 2013-06-16 DIAGNOSIS — M1710 Unilateral primary osteoarthritis, unspecified knee: Secondary | ICD-10-CM

## 2013-06-16 DIAGNOSIS — M170 Bilateral primary osteoarthritis of knee: Secondary | ICD-10-CM

## 2013-06-16 NOTE — Progress Notes (Signed)
  Subjective:    CC: Followup  HPI: Knee osteoarthritis:  Bilateral injection performed at the last visit, she has not yet noted relief. At this point she has pain localized to both joint lines, moderate, persistent, without radiation, stiffness in the morning, she is now a candidate for viscus supplementation.  Past medical history, Surgical history, Family history not pertinant except as noted below, Social history, Allergies, and medications have been entered into the medical record, reviewed, and no changes needed.   Review of Systems: No fevers, chills, night sweats, weight loss, chest pain, or shortness of breath.   Objective:    General: Well Developed, well nourished, and in no acute distress.  Neuro: Alert and oriented x3, extra-ocular muscles intact, sensation grossly intact.  HEENT: Normocephalic, atraumatic, pupils equal round reactive to light, neck supple, no masses, no lymphadenopathy, thyroid nonpalpable.  Skin: Warm and dry, no rashes. Cardiac: Regular rate and rhythm, no murmurs rubs or gallops, no lower extremity edema.  Respiratory: Clear to auscultation bilaterally. Not using accessory muscles, speaking in full sentences.  Procedure: Real-time Ultrasound Guided Injection of left knee Device: GE Logiq E  Verbal informed consent obtained.  Time-out conducted.  Noted no overlying erythema, induration, or other signs of local infection.  Skin prepped in a sterile fashion.  Local anesthesia: Topical Ethyl chloride.  With sterile technique and under real time ultrasound guidance:  25 mg/2.5 mL of Supartz (sodium hyaluronate) in a prefilled syringe was injected easily into the knee through a 22-gauge needle. Completed without difficulty  Pain immediately resolved suggesting accurate placement of the medication.  Advised to call if fevers/chills, erythema, induration, drainage, or persistent bleeding.  Images permanently stored and available for review in the ultrasound  unit.  Impression: Technically successful ultrasound guided injection.  Procedure: Real-time Ultrasound Guided Injection of right knee Device: GE Logiq E  Verbal informed consent obtained.  Time-out conducted.  Noted no overlying erythema, induration, or other signs of local infection.  Skin prepped in a sterile fashion.  Local anesthesia: Topical Ethyl chloride.  With sterile technique and under real time ultrasound guidance:  25 mg/2.5 mL of Supartz (sodium hyaluronate) in a prefilled syringe was injected easily into the knee through a 22-gauge needle. Completed without difficulty  Pain immediately resolved suggesting accurate placement of the medication.  Advised to call if fevers/chills, erythema, induration, drainage, or persistent bleeding.  Images permanently stored and available for review in the ultrasound unit.  Impression: Technically successful ultrasound guided injection.  Impression and Recommendations:

## 2013-06-16 NOTE — Assessment & Plan Note (Signed)
Insufficient response to initial steroid injection. Physical supplementation started with Supartz injection #1 of 5 into both knees today. Return in one week for injection # 2 of 5.

## 2013-06-18 ENCOUNTER — Telehealth: Payer: Self-pay | Admitting: *Deleted

## 2013-06-23 ENCOUNTER — Encounter: Payer: Self-pay | Admitting: Sports Medicine

## 2013-06-23 ENCOUNTER — Ambulatory Visit (INDEPENDENT_AMBULATORY_CARE_PROVIDER_SITE_OTHER): Payer: 59 | Admitting: Sports Medicine

## 2013-06-23 VITALS — BP 127/80 | HR 70 | Wt 224.0 lb

## 2013-06-23 DIAGNOSIS — M1710 Unilateral primary osteoarthritis, unspecified knee: Secondary | ICD-10-CM

## 2013-06-23 DIAGNOSIS — M170 Bilateral primary osteoarthritis of knee: Secondary | ICD-10-CM

## 2013-06-23 NOTE — Assessment & Plan Note (Signed)
Supartz injection #2 of 5 into both knees. Return in one week for #3. 

## 2013-06-23 NOTE — Progress Notes (Signed)
  Procedure: Real-time Ultrasound Guided Injection of left knee Device: GE Logiq E  Verbal informed consent obtained.  Time-out conducted.  Noted no overlying erythema, induration, or other signs of local infection.  Skin prepped in a sterile fashion.  Local anesthesia: Topical Ethyl chloride.  With sterile technique and under real time ultrasound guidance:  25 mg/2.5 mL of Supartz (sodium hyaluronate) in a prefilled syringe was injected easily into the knee through a 22-gauge needle. Completed without difficulty  Pain immediately resolved suggesting accurate placement of the medication.  Advised to call if fevers/chills, erythema, induration, drainage, or persistent bleeding.  Images permanently stored and available for review in the ultrasound unit.  Impression: Technically successful ultrasound guided injection.  Procedure: Real-time Ultrasound Guided Injection of right knee Device: GE Logiq E  Verbal informed consent obtained.  Time-out conducted.  Noted no overlying erythema, induration, or other signs of local infection.  Skin prepped in a sterile fashion.  Local anesthesia: Topical Ethyl chloride.  With sterile technique and under real time ultrasound guidance:  25 mg/2.5 mL of Supartz (sodium hyaluronate) in a prefilled syringe was injected easily into the knee through a 22-gauge needle. Completed without difficulty  Pain immediately resolved suggesting accurate placement of the medication.  Advised to call if fevers/chills, erythema, induration, drainage, or persistent bleeding.  Images permanently stored and available for review in the ultrasound unit.  Impression: Technically successful ultrasound guided injection. 

## 2013-06-30 ENCOUNTER — Ambulatory Visit (INDEPENDENT_AMBULATORY_CARE_PROVIDER_SITE_OTHER): Payer: 59 | Admitting: Sports Medicine

## 2013-06-30 ENCOUNTER — Encounter: Payer: Self-pay | Admitting: Sports Medicine

## 2013-06-30 VITALS — BP 124/77 | HR 83 | Wt 217.0 lb

## 2013-06-30 DIAGNOSIS — M1710 Unilateral primary osteoarthritis, unspecified knee: Secondary | ICD-10-CM

## 2013-06-30 DIAGNOSIS — M170 Bilateral primary osteoarthritis of knee: Secondary | ICD-10-CM

## 2013-06-30 NOTE — Assessment & Plan Note (Signed)
Supartz injection #3 of 5 given into both knees. Return in one week for #4 of 5

## 2013-06-30 NOTE — Progress Notes (Signed)
  Procedure: Real-time Ultrasound Guided Injection of left knee Device: GE Logiq E  Verbal informed consent obtained.  Time-out conducted.  Noted no overlying erythema, induration, or other signs of local infection.  Skin prepped in a sterile fashion.  Local anesthesia: Topical Ethyl chloride.  With sterile technique and under real time ultrasound guidance:  25 mg/2.5 mL of Supartz (sodium hyaluronate) in a prefilled syringe was injected easily into the knee through a 22-gauge needle. Completed without difficulty  Pain immediately resolved suggesting accurate placement of the medication.  Advised to call if fevers/chills, erythema, induration, drainage, or persistent bleeding.  Images permanently stored and available for review in the ultrasound unit.  Impression: Technically successful ultrasound guided injection.  Procedure: Real-time Ultrasound Guided Injection of right knee Device: GE Logiq E  Verbal informed consent obtained.  Time-out conducted.  Noted no overlying erythema, induration, or other signs of local infection.  Skin prepped in a sterile fashion.  Local anesthesia: Topical Ethyl chloride.  With sterile technique and under real time ultrasound guidance:  25 mg/2.5 mL of Supartz (sodium hyaluronate) in a prefilled syringe was injected easily into the knee through a 22-gauge needle. Completed without difficulty  Pain immediately resolved suggesting accurate placement of the medication.  Advised to call if fevers/chills, erythema, induration, drainage, or persistent bleeding.  Images permanently stored and available for review in the ultrasound unit.  Impression: Technically successful ultrasound guided injection. 

## 2013-07-10 ENCOUNTER — Ambulatory Visit (INDEPENDENT_AMBULATORY_CARE_PROVIDER_SITE_OTHER): Payer: 59 | Admitting: Sports Medicine

## 2013-07-10 DIAGNOSIS — M170 Bilateral primary osteoarthritis of knee: Secondary | ICD-10-CM

## 2013-07-10 DIAGNOSIS — M1710 Unilateral primary osteoarthritis, unspecified knee: Secondary | ICD-10-CM

## 2013-07-10 MED ORDER — TRAMADOL HCL 50 MG PO TABS: 50.0000 mg | ORAL_TABLET | Freq: Three times a day (TID) | ORAL | Status: DC | PRN

## 2013-07-10 NOTE — Progress Notes (Signed)
  Procedure: Real-time Ultrasound Guided Injection of left knee Device: GE Logiq E  Verbal informed consent obtained.  Time-out conducted.  Noted no overlying erythema, induration, or other signs of local infection.  Skin prepped in a sterile fashion.  Local anesthesia: Topical Ethyl chloride.  With sterile technique and under real time ultrasound guidance:  25 mg/2.5 mL of Supartz (sodium hyaluronate) in a prefilled syringe was injected easily into the knee through a 22-gauge needle. Completed without difficulty  Pain immediately resolved suggesting accurate placement of the medication.  Advised to call if fevers/chills, erythema, induration, drainage, or persistent bleeding.  Images permanently stored and available for review in the ultrasound unit.  Impression: Technically successful ultrasound guided injection.  Procedure: Real-time Ultrasound Guided Injection of right knee Device: GE Logiq E  Verbal informed consent obtained.  Time-out conducted.  Noted no overlying erythema, induration, or other signs of local infection.  Skin prepped in a sterile fashion.  Local anesthesia: Topical Ethyl chloride.  With sterile technique and under real time ultrasound guidance:  25 mg/2.5 mL of Supartz (sodium hyaluronate) in a prefilled syringe was injected easily into the knee through a 22-gauge needle. Completed without difficulty  Pain immediately resolved suggesting accurate placement of the medication.  Advised to call if fevers/chills, erythema, induration, drainage, or persistent bleeding.  Images permanently stored and available for review in the ultrasound unit.  Impression: Technically successful ultrasound guided injection. 

## 2013-07-10 NOTE — Assessment & Plan Note (Signed)
Supartz injection #405 given today to both knees. Return in one week for #5. She is over 50% improved. Refilled tramadol.

## 2013-07-14 ENCOUNTER — Encounter: Payer: Self-pay | Admitting: Sports Medicine

## 2013-07-14 ENCOUNTER — Ambulatory Visit (INDEPENDENT_AMBULATORY_CARE_PROVIDER_SITE_OTHER): Payer: 59 | Admitting: Sports Medicine

## 2013-07-14 VITALS — BP 107/72 | HR 99 | Wt 207.0 lb

## 2013-07-14 DIAGNOSIS — M170 Bilateral primary osteoarthritis of knee: Secondary | ICD-10-CM

## 2013-07-14 DIAGNOSIS — M1710 Unilateral primary osteoarthritis, unspecified knee: Secondary | ICD-10-CM

## 2013-07-14 NOTE — Progress Notes (Signed)
  Procedure: Real-time Ultrasound Guided Injection of left knee Device: GE Logiq E  Verbal informed consent obtained.  Time-out conducted.  Noted no overlying erythema, induration, or other signs of local infection.  Skin prepped in a sterile fashion.  Local anesthesia: Topical Ethyl chloride.  With sterile technique and under real time ultrasound guidance:  25 mg/2.5 mL of Supartz (sodium hyaluronate) in a prefilled syringe was injected easily into the knee through a 22-gauge needle. Completed without difficulty  Pain immediately resolved suggesting accurate placement of the medication.  Advised to call if fevers/chills, erythema, induration, drainage, or persistent bleeding.  Images permanently stored and available for review in the ultrasound unit.  Impression: Technically successful ultrasound guided injection.  Procedure: Real-time Ultrasound Guided Injection of right knee Device: GE Logiq E  Verbal informed consent obtained.  Time-out conducted.  Noted no overlying erythema, induration, or other signs of local infection.  Skin prepped in a sterile fashion.  Local anesthesia: Topical Ethyl chloride.  With sterile technique and under real time ultrasound guidance:  25 mg/2.5 mL of Supartz (sodium hyaluronate) in a prefilled syringe was injected easily into the knee through a 22-gauge needle. Completed without difficulty  Pain immediately resolved suggesting accurate placement of the medication.  Advised to call if fevers/chills, erythema, induration, drainage, or persistent bleeding.  Images permanently stored and available for review in the ultrasound unit.  Impression: Technically successful ultrasound guided injection. 

## 2013-07-14 NOTE — Assessment & Plan Note (Signed)
Supartz injection #5 into both knees as above. Per patient, 55% ever since we started this. Return in one month.

## 2013-08-11 ENCOUNTER — Ambulatory Visit: Payer: 59 | Admitting: Sports Medicine

## 2013-08-14 ENCOUNTER — Ambulatory Visit (INDEPENDENT_AMBULATORY_CARE_PROVIDER_SITE_OTHER): Payer: 59 | Admitting: Sports Medicine

## 2013-08-14 ENCOUNTER — Encounter: Payer: Self-pay | Admitting: Sports Medicine

## 2013-08-14 VITALS — BP 121/76 | HR 87 | Ht 66.0 in | Wt 206.0 lb

## 2013-08-14 DIAGNOSIS — M1710 Unilateral primary osteoarthritis, unspecified knee: Secondary | ICD-10-CM

## 2013-08-14 DIAGNOSIS — M170 Bilateral primary osteoarthritis of knee: Secondary | ICD-10-CM

## 2013-08-14 NOTE — Progress Notes (Signed)
  Subjective:    CC: Follow up  HPI: Bilateral knee osteoarthritis: Finished Supartz series, was doing extremely well, pain-free, unfortunately twisted and felt a pop in her right knee and had some swelling, and mild pain, this has since resolved. Tramadol is still effective for her pain.  Past medical history, Surgical history, Family history not pertinant except as noted below, Social history, Allergies, and medications have been entered into the medical record, reviewed, and no changes needed.   Review of Systems: No fevers, chills, night sweats, weight loss, chest pain, or shortness of breath.   Objective:    General: Well Developed, well nourished, and in no acute distress.  Neuro: Alert and oriented x3, extra-ocular muscles intact, sensation grossly intact.  HEENT: Normocephalic, atraumatic, pupils equal round reactive to light, neck supple, no masses, no lymphadenopathy, thyroid nonpalpable.  Skin: Warm and dry, no rashes. Cardiac: Regular rate and rhythm, no murmurs rubs or gallops, no lower extremity edema.  Respiratory: Clear to auscultation bilaterally. Not using accessory muscles, speaking in full sentences. Right Knee: Normal to inspection with no erythema or effusion or obvious bony abnormalities. Palpation normal with no warmth, joint line tenderness, patellar tenderness, or condyle tenderness. ROM full in flexion and extension and lower leg rotation. Ligaments with solid consistent endpoints including ACL, PCL, LCL, MCL. Negative Mcmurray's, Apley's, and Thessalonian tests. Non painful patellar compression. Patellar glide without crepitus. Patellar and quadriceps tendons unremarkable. Hamstring and quadriceps strength is normal.   Impression and Recommendations:

## 2013-08-14 NOTE — Assessment & Plan Note (Signed)
Continue do well, it does sound as though she had a simple sprain which has resolved. Return to see me as needed, she is currently pain free.

## 2013-10-06 ENCOUNTER — Other Ambulatory Visit: Payer: Self-pay | Admitting: Obstetrics and Gynecology

## 2014-05-06 ENCOUNTER — Emergency Department (HOSPITAL_BASED_OUTPATIENT_CLINIC_OR_DEPARTMENT_OTHER): Payer: 59

## 2014-05-06 ENCOUNTER — Encounter (HOSPITAL_BASED_OUTPATIENT_CLINIC_OR_DEPARTMENT_OTHER): Payer: Self-pay | Admitting: *Deleted

## 2014-05-06 ENCOUNTER — Emergency Department (HOSPITAL_BASED_OUTPATIENT_CLINIC_OR_DEPARTMENT_OTHER)
Admission: EM | Admit: 2014-05-06 | Discharge: 2014-05-06 | Disposition: A | Discharge status: Home / Self Care | Payer: 59 | Attending: Emergency Medicine | Admitting: Emergency Medicine

## 2014-05-06 DIAGNOSIS — R05 Cough: Secondary | ICD-10-CM

## 2014-05-06 DIAGNOSIS — J9801 Acute bronchospasm: Secondary | ICD-10-CM | POA: Diagnosis not present

## 2014-05-06 DIAGNOSIS — Z7952 Long term (current) use of systemic steroids: Secondary | ICD-10-CM | POA: Insufficient documentation

## 2014-05-06 DIAGNOSIS — R062 Wheezing: Secondary | ICD-10-CM

## 2014-05-06 DIAGNOSIS — R059 Cough, unspecified: Secondary | ICD-10-CM

## 2014-05-06 DIAGNOSIS — E039 Hypothyroidism, unspecified: Secondary | ICD-10-CM | POA: Diagnosis not present

## 2014-05-06 DIAGNOSIS — R000 Tachycardia, unspecified: Secondary | ICD-10-CM | POA: Diagnosis not present

## 2014-05-06 DIAGNOSIS — I10 Essential (primary) hypertension: Secondary | ICD-10-CM | POA: Insufficient documentation

## 2014-05-06 DIAGNOSIS — Z862 Personal history of diseases of the blood and blood-forming organs and certain disorders involving the immune mechanism: Secondary | ICD-10-CM | POA: Insufficient documentation

## 2014-05-06 DIAGNOSIS — Z79899 Other long term (current) drug therapy: Secondary | ICD-10-CM | POA: Diagnosis not present

## 2014-05-06 MED ORDER — PREDNISONE 20 MG PO TABS: 40.0000 mg | ORAL_TABLET | Freq: Every day | ORAL | Status: DC

## 2014-05-06 MED ORDER — ALBUTEROL SULFATE (2.5 MG/3ML) 0.083% IN NEBU
5.0000 mg | INHALATION_SOLUTION | Freq: Once | RESPIRATORY_TRACT | Status: AC
  Administered 2014-05-06: 5 mg via RESPIRATORY_TRACT
  Filled 2014-05-06: qty 6

## 2014-05-06 MED ORDER — PREDNISONE 50 MG PO TABS
60.0000 mg | ORAL_TABLET | Freq: Once | ORAL | Status: AC
  Administered 2014-05-06: 60 mg via ORAL
  Filled 2014-05-06 (×2): qty 1

## 2014-05-06 MED ORDER — BENZONATATE 100 MG PO CAPS: 200.0000 mg | ORAL_CAPSULE | Freq: Two times a day (BID) | ORAL | Status: DC | PRN

## 2014-05-06 MED ORDER — ALBUTEROL SULFATE HFA 108 (90 BASE) MCG/ACT IN AERS
2.0000 | INHALATION_SPRAY | Freq: Once | RESPIRATORY_TRACT | Status: DC
  Filled 2014-05-06: qty 6.7

## 2014-05-06 NOTE — Discharge Instructions (Signed)
1. Medications: Prednisone, albuterol inhaler, Tessalon, usual home medications 2. Treatment: rest, drink plenty of fluids, use inhaler for wheezing, chest tightness or persistent coughing, use Tessalon for cough specifically at nighttime 3. Follow Up: Please followup with your primary doctor in 5 days for discussion of your diagnoses and further evaluation after today's visit; if you do not have a primary care doctor use the resource guide provided to find one; Please return to the ER for difficulty breathing, worsening symptoms, high fevers or other concerns   Bronchospasm A bronchospasm is a spasm or tightening of the airways going into the lungs. During a bronchospasm breathing becomes more difficult because the airways get smaller. When this happens there can be coughing, a whistling sound when breathing (wheezing), and difficulty breathing. Bronchospasm is often associated with asthma, but not all patients who experience a bronchospasm have asthma. CAUSES  A bronchospasm is caused by inflammation or irritation of the airways. The inflammation or irritation may be triggered by:   Allergies (such as to animals, pollen, food, or mold). Allergens that cause bronchospasm may cause wheezing immediately after exposure or many hours later.   Infection. Viral infections are believed to be the most common cause of bronchospasm.   Exercise.   Irritants (such as pollution, cigarette smoke, strong odors, aerosol sprays, and paint fumes).   Weather changes. Winds increase molds and pollens in the air. Rain refreshes the air by washing irritants out. Cold air may cause inflammation.   Stress and emotional upset.  SIGNS AND SYMPTOMS   Wheezing.   Excessive nighttime coughing.   Frequent or severe coughing with a simple cold.   Chest tightness.   Shortness of breath.  DIAGNOSIS  Bronchospasm is usually diagnosed through a history and physical exam. Tests, such as chest X-rays, are  sometimes done to look for other conditions. TREATMENT   Inhaled medicines can be given to open up your airways and help you breathe. The medicines can be given using either an inhaler or a nebulizer machine.  Corticosteroid medicines may be given for severe bronchospasm, usually when it is associated with asthma. HOME CARE INSTRUCTIONS   Always have a plan prepared for seeking medical care. Know when to call your health care provider and local emergency services (911 in the [place] Know where you can access local emergency care.  Only take medicines as directed by your health care provider.  If you were prescribed an inhaler or nebulizer machine, ask your health care provider to explain how to use it correctly. Always use a spacer with your inhaler if you were given one.  It is necessary to remain calm during an attack. Try to relax and breathe more slowly.  Control your home environment in the following ways:   Change your heating and air conditioning filter at least once a month.   Limit your use of fireplaces and wood stoves.  Do not smoke and do not allow smoking in your home.   Avoid exposure to perfumes and fragrances.   Get rid of pests (such as roaches and mice) and their droppings.   Throw away plants if you see mold on them.   Keep your house clean and dust free.   Replace carpet with wood, tile, or vinyl flooring. Carpet can trap dander and dust.   Use allergy-proof pillows, mattress covers, and box spring covers.   Wash bed sheets and blankets every week in hot water and dry them in a dryer.   Use blankets that are  made of polyester or cotton.   Wash hands frequently. SEEK MEDICAL CARE IF:   You have muscle aches.   You have chest pain.   The sputum changes from clear or white to yellow, green, gray, or bloody.   The sputum you cough up gets thicker.   There are problems that may be related to the medicine you are given, such as a rash,  itching, swelling, or trouble breathing.  SEEK IMMEDIATE MEDICAL CARE IF:   You have worsening wheezing and coughing even after taking your prescribed medicines.   You have increased difficulty breathing.   You develop severe chest pain. MAKE SURE YOU:   Understand these instructions.  Will watch your condition.  Will get help right away if you are not doing well or get worse. Document Released: 05/25/2003 Document Revised: 05/27/2013 Document Reviewed: 11/11/2012 [facility] Patient Information 2015 [employer], [place]. This information is not intended to replace advice given to you by your health care provider. Make sure you discuss any questions you have with your health care provider.

## 2014-05-06 NOTE — ED Notes (Signed)
Pt c/o cough x 2 weeks  

## 2014-05-06 NOTE — Patient Instructions (Signed)
Instructed patient on the proper use of administering albuterol mdi via aerochamber patient tolerated well 

## 2014-05-06 NOTE — ED Provider Notes (Signed)
CSN: [id number]     Arrival date & time 05/06/14  1827 History   First MD Initiated Contact with Patient 05/06/14 1934     Chief Complaint  Patient presents with  . Cough     (Consider location/radiation/quality/duration/timing/severity/associated sxs/prior Treatment) Patient is a 48 y.o. female presenting with cough. The history is provided by the patient and medical records. No language interpreter was used.  Cough Associated symptoms: chest pain (with coughing only), shortness of breath (For severe bouts of coughing) and wheezing   Associated symptoms: no diaphoresis, no fever, no headaches and no rash      Myrtie CruiseShelia C Rayo is a 48 y.o. female  with a hx of anemia, hypertension presents to the Emergency Department complaining of gradual, persistent, progressively worsening cough with associated shortness of breath onset 3 weeks ago during a URI. Patient reports at that time she had nasal congestion, rhinorrhea, cough, postnasal drip and sore throat but this resolved and she has had this persistent cough. Patient reports her cough is nonproductive and his night. She reports that she often coughs until she feels short of breath.  She has no history of COPD or asthma.  She has never smoked. Patient reports associated chest wall pain with coughing but no other time. Patient reports taking over-the-counter cough medication without relief. She denies fever, chills, headache, neck pain, abdominal pain, nausea, vomiting, diarrhea, weakness, dizziness, syncope.  Past Medical History  Diagnosis Date  . Hypothyroid   . Anemia   . Hypertension    Past Surgical History  Procedure Laterality Date  . Gastric bypass    . Myomectomy     Family History  Problem Relation Age of Onset  . Hypertension Mother   . Hypertension Father    History  Substance Use Topics  . Smoking status: Never Smoker   . Smokeless tobacco: Not on file  . Alcohol Use: No   OB History    No data available      Review of Systems  Constitutional: Negative for fever, diaphoresis, appetite change, fatigue and unexpected weight change.  HENT: Negative for mouth sores.   Eyes: Negative for visual disturbance.  Respiratory: Positive for cough, chest tightness, shortness of breath (For severe bouts of coughing) and wheezing.   Cardiovascular: Positive for chest pain (with coughing only).  Gastrointestinal: Negative for nausea, vomiting, abdominal pain, diarrhea and constipation.  Endocrine: Negative for polydipsia, polyphagia and polyuria.  Genitourinary: Negative for dysuria, urgency, frequency and hematuria.  Musculoskeletal: Negative for back pain and neck stiffness.  Skin: Negative for rash.  Allergic/Immunologic: Negative for immunocompromised state.  Neurological: Negative for syncope, light-headedness and headaches.  Hematological: Does not bruise/bleed easily.  Psychiatric/Behavioral: Negative for sleep disturbance. The patient is not nervous/anxious.       Allergies  Azithromycin and Ibuprofen  Home Medications   Prior to Admission medications   Medication Sig Start Date End Date Taking? Authorizing Provider  benzonatate (TESSALON) 100 MG capsule Take 2 capsules (200 mg total) by mouth 2 (two) times daily as needed for cough. 05/06/14   Jaritza Duignan, PA-C  diazepam (VALIUM) 5 MG tablet Take 1 tab PO 2 hours before procedure or imaging. 05/19/13   Monica Bectonhomas J Thekkekandam, MD  Levothyroxine Sodium (SYNTHROID PO) Take by mouth.      Historical Provider, MD  omeprazole (PRILOSEC) 20 MG capsule Take 1 capsule (20 mg total) by mouth daily. Take 30 minutes before evening meal 04/21/13   Lattie HawStephen A Beese, MD  predniSONE (DELTASONE) 20  MG tablet Take 2 tablets (40 mg total) by mouth daily. 05/06/14   Darald Uzzle, PA-C  traMADol (ULTRAM) 50 MG tablet Take 1 tablet (50 mg total) by mouth 3 (three) times daily as needed. 07/10/13   Monica Bectonhomas J Thekkekandam, MD  triamterene-hydrochlorothiazide  (DYAZIDE) 37.5-25 MG per capsule Take 1 capsule by mouth every morning.      Historical Provider, MD   BP 124/100 mmHg  Pulse 102  Temp(Src) 98.9 F (37.2 C) (Oral)  Resp 18  Ht 5\' 6"  (1.676 m)  Wt 160 lb (72.576 kg)  BMI 25.84 kg/m2  SpO2 99%  LMP 05/06/2014 Physical Exam  Constitutional: She is oriented to person, place, and time. She appears well-developed and well-nourished. No distress.  HENT:  Head: Normocephalic and atraumatic.  Right Ear: Tympanic membrane, external ear and ear canal normal.  Left Ear: Tympanic membrane, external ear and ear canal normal.  Nose: Mucosal edema and rhinorrhea present. No epistaxis. Right sinus exhibits no maxillary sinus tenderness and no frontal sinus tenderness. Left sinus exhibits no maxillary sinus tenderness and no frontal sinus tenderness.  Mouth/Throat: Uvula is midline and mucous membranes are normal. Mucous membranes are not pale and not cyanotic. No oropharyngeal exudate, posterior oropharyngeal edema, posterior oropharyngeal erythema or tonsillar abscesses.  Eyes: Conjunctivae are normal. Pupils are equal, round, and reactive to light.  Neck: Normal range of motion and full passive range of motion without pain.  Cardiovascular: Normal heart sounds and intact distal pulses.   Mild tachycardia  Pulmonary/Chest: Effort normal. No accessory muscle usage or stridor. No tachypnea. No respiratory distress. She has decreased breath sounds. She has wheezes. She has no rhonchi. She has no rales.  Diminished and coarse breath sounds throughout with scattered wheezes Equal chest rise No focal rhonchi or rails  Abdominal: Soft. Bowel sounds are normal. There is no tenderness.  Musculoskeletal: Normal range of motion.  Lymphadenopathy:    She has no cervical adenopathy.  Neurological: She is alert and oriented to person, place, and time.  Skin: Skin is warm and dry. No rash noted. She is not diaphoretic.  Psychiatric: She has a normal mood and  affect.  Nursing note and vitals reviewed.   ED Course  Procedures (including critical care time) Labs Review Labs Reviewed - No data to display  Imaging Review Dg Chest 2 View  05/06/2014   CLINICAL DATA:  Shortness of breath, cough, chest pain for 2-1/2 weeks  EXAM: CHEST  2 VIEW  COMPARISON:  04/21/2013  FINDINGS: Cardiomediastinal silhouette is stable. No acute infiltrate or pleural effusion. No pulmonary edema. Mild degenerative changes thoracic spine.  IMPRESSION: No active cardiopulmonary disease.   Electronically Signed   By: Natasha MeadLiviu  Pop M.D.   On: 05/06/2014 19:03     EKG Interpretation None      MDM   Final diagnoses:  Post-infection bronchospasm  Wheezing   Ianna C Perfect presents with cough, wheeze and shortness of breath for several weeks. Patient likely with reactive airway secondary to a virus versus persistent bronchitic cough after URI 3 weeks ago. Will give albuterol and reassess. Chest x-ray without evidence of pneumonia and no focal lung sounds on exam.  8:41 PM Patient with significantly improved tidal volume and lung sounds after albuterol administration.  Course breath sounds throughout but no focal wheezes, rhonchi or rails  Patient ambulated in ED with O2 saturations maintained >90, no current signs of respiratory distress. Lung exam improved after nebulizer treatment. Prednisone given in the ED and pt  will be dc with 5 day burst. Pt states they are breathing at baseline. Pt has been instructed to use prescribed medications and to speak with PCP about today's visit to the ER  I have personally reviewed patient's vitals, nursing note and any pertinent labs or imaging.  I performed an focused physical exam; undressed when appropriate .    It has been determined that no acute conditions requiring further emergency intervention are present at this time. The patient/guardian have been advised of the diagnosis and plan. I reviewed any labs and imaging including  any potential incidental findings. We have discussed signs and symptoms that warrant return to the ED and they are listed in the discharge instructions.    Vital signs are stable at discharge.   BP 124/100 mmHg  Pulse 102  Temp(Src) 98.9 F (37.2 C) (Oral)  Resp 18  Ht 5\' 6"  (1.676 m)  Wt 160 lb (72.576 kg)  BMI 25.84 kg/m2  SpO2 99%  LMP 05/06/2014      .    Dahlia Client Velinda Wrobel, PA-C 05/06/14 2045  Geoffery Lyons, MD 05/06/14 [phone]

## 2014-10-24 ENCOUNTER — Encounter (HOSPITAL_COMMUNITY): Payer: Self-pay | Admitting: *Deleted

## 2014-10-24 ENCOUNTER — Emergency Department (HOSPITAL_COMMUNITY)
Admission: EM | Admit: 2014-10-24 | Discharge: 2014-10-24 | Disposition: A | Discharge status: Home / Self Care | Payer: BLUE CROSS/BLUE SHIELD | Source: Home / Self Care | Attending: Family Medicine | Admitting: Family Medicine

## 2014-10-24 DIAGNOSIS — K2100 Gastro-esophageal reflux disease with esophagitis, without bleeding: Secondary | ICD-10-CM

## 2014-10-24 DIAGNOSIS — R05 Cough: Secondary | ICD-10-CM

## 2014-10-24 DIAGNOSIS — R059 Cough, unspecified: Secondary | ICD-10-CM

## 2014-10-24 HISTORY — DX: Gastro-esophageal reflux disease without esophagitis: K21.9

## 2014-10-24 MED ORDER — OMEPRAZOLE 40 MG PO CPDR: DELAYED_RELEASE_CAPSULE | ORAL | Status: DC

## 2014-10-24 MED ORDER — HYDROCOD POLST-CPM POLST ER 10-8 MG/5ML PO SUER
2.5000 mL | Freq: Every evening | ORAL | Status: DC | PRN
Start: 2014-10-24 — End: 2014-12-21

## 2014-10-24 MED ORDER — BENZONATATE 100 MG PO CAPS: 100.0000 mg | ORAL_CAPSULE | Freq: Three times a day (TID) | ORAL | Status: DC | PRN

## 2014-10-24 NOTE — ED Notes (Signed)
Pt c/o productive cough x 3 wks without fever.  C/O chest discomfort with cough.  States unable to sleep due to cough; keeping other family members up with cough.  Pt suspicious if R/T GERD - has not been on PPI x approx 3 mo.

## 2014-10-24 NOTE — ED Provider Notes (Signed)
CSN: [id number]     Arrival date & time 10/24/14  0902 History   First MD Initiated Contact with Patient 10/24/14 [phone]     Chief Complaint  Patient presents with  . Cough   (Consider location/radiation/quality/duration/timing/severity/associated sxs/prior Treatment) HPI   Copugh started 4 weeks ago. Worse at night. "chest rattles." some phlegm production. Sore chest from coughing. Getting worse. Intermittent. Associated w/ reflux. Denies runny nose, isnus pressure, fevers or chest pain, palpitations, headache, neck stiffness, facial pain, abdominal pain, dysuria, frequency, back pain.. Improves when sitting up to sleep.  Worse when eats right before bed.      Past Medical History  Diagnosis Date  . Hypothyroid   . Anemia   . Hypertension   . GERD (gastroesophageal reflux disease)    Past Surgical History  Procedure Laterality Date  . Gastric bypass    . Myomectomy     Family History  Problem Relation Age of Onset  . Hypertension Mother   . Hypertension Father    History  Substance Use Topics  . Smoking status: Never Smoker   . Smokeless tobacco: Not on file  . Alcohol Use: No   OB History    No data available     Review of Systems Per HPI with all other pertinent systems negative.   Allergies  Azithromycin and Ibuprofen  Home Medications   Prior to Admission medications   Medication Sig Start Date End Date Taking? Authorizing Provider  benzonatate (TESSALON PERLES) 100 MG capsule Take 1-2 capsules (100-200 mg total) by mouth 3 (three) times daily as needed for cough. 10/24/14   Ozella Rocks, MD  chlorpheniramine-HYDROcodone [facility] PENNKINETIC ER) 10-8 MG/5ML SUER Take 2.5-5 mLs by mouth at bedtime as needed for cough. 10/24/14   Ozella Rocks, MD  Levothyroxine Sodium (SYNTHROID PO) Take by mouth.      Historical Provider, MD  omeprazole (PRILOSEC) 40 MG capsule Take 1 capsule daily for 14 days then as needed as symptoms return. 10/24/14   Ozella Rocks, MD  triamterene-hydrochlorothiazide (DYAZIDE) 37.5-25 MG per capsule Take 1 capsule by mouth every morning.      Historical Provider, MD   BP 147/86 mmHg  Pulse 67  Temp(Src) 98.8 F (37.1 C) (Oral)  Resp 18  SpO2 98%  LMP 05/06/2014 Physical Exam Physical Exam  Constitutional: oriented to person, place, and time. appears well-developed and well-nourished. No distress.  HENT:  Head: Normocephalic and atraumatic.  Eyes: EOMI. PERRL.  Neck: Normal range of motion.  Cardiovascular: RRR, no m/r/g, 2+ distal pulses,  Pulmonary/Chest: Effort normal and breath sounds normal. No respiratory distress.  Abdominal: Soft. Bowel sounds are normal. NonTTP, no distension.  Musculoskeletal: Normal range of motion. Non ttp, no effusion.  Neurological: alert and oriented to person, place, and time.  Skin: Skin is warm. No rash noted. non diaphoretic.  Psychiatric: normal mood and affect. behavior is normal. Judgment and thought content normal.   ED Course  Procedures (including critical care time) Labs Review Labs Reviewed - No data to display  Imaging Review No results found.   MDM   1. Reflux esophagitis   2. Cough    Cough is primarily due to reflux esophagitis though there may be some mild underlying allergy etiology. Patient start 14 day trial of Prilosec and Zantac and use Prilosec when necessary. Patient to avoid sugar foods. Patient use Tessalon Perles during the daytime so she can work and use very small amount of Tussionex at night if  needed for additional cough and sleep relief as patient is only sleeping 3 hours per night. Patient start using a daily allergy pill such as Zyrtec. Follow-up with PCP as needed.    Ozella Rocksavid J Merrell, MD 10/24/14 1006

## 2014-10-24 NOTE — Discharge Instructions (Signed)
Her cough is likely due to reflux and throat irritation. Please start the Prilosec every day to reduce the acid in her stomach. Please take this for 14 days then as needed. Please also start some form of an H2 blocker such as Zantac. Please avoid your trigger foods. Please use the Tessalon Perles during the daytime to help with additional cough relief. Also consider using a daily allergy pill just in case this is contributing to your symptoms.

## 2014-11-07 ENCOUNTER — Emergency Department (HOSPITAL_BASED_OUTPATIENT_CLINIC_OR_DEPARTMENT_OTHER): Payer: BLUE CROSS/BLUE SHIELD

## 2014-11-07 ENCOUNTER — Encounter (HOSPITAL_BASED_OUTPATIENT_CLINIC_OR_DEPARTMENT_OTHER): Payer: Self-pay | Admitting: Emergency Medicine

## 2014-11-07 ENCOUNTER — Emergency Department (HOSPITAL_BASED_OUTPATIENT_CLINIC_OR_DEPARTMENT_OTHER)
Admission: EM | Admit: 2014-11-07 | Discharge: 2014-11-07 | Disposition: A | Discharge status: Home / Self Care | Payer: BLUE CROSS/BLUE SHIELD | Attending: Emergency Medicine | Admitting: Emergency Medicine

## 2014-11-07 DIAGNOSIS — R1110 Vomiting, unspecified: Secondary | ICD-10-CM

## 2014-11-07 DIAGNOSIS — K449 Diaphragmatic hernia without obstruction or gangrene: Secondary | ICD-10-CM | POA: Insufficient documentation

## 2014-11-07 DIAGNOSIS — K219 Gastro-esophageal reflux disease without esophagitis: Secondary | ICD-10-CM | POA: Insufficient documentation

## 2014-11-07 DIAGNOSIS — Z862 Personal history of diseases of the blood and blood-forming organs and certain disorders involving the immune mechanism: Secondary | ICD-10-CM | POA: Insufficient documentation

## 2014-11-07 DIAGNOSIS — Z79899 Other long term (current) drug therapy: Secondary | ICD-10-CM | POA: Insufficient documentation

## 2014-11-07 DIAGNOSIS — E039 Hypothyroidism, unspecified: Secondary | ICD-10-CM | POA: Diagnosis not present

## 2014-11-07 DIAGNOSIS — R112 Nausea with vomiting, unspecified: Secondary | ICD-10-CM | POA: Diagnosis present

## 2014-11-07 DIAGNOSIS — I10 Essential (primary) hypertension: Secondary | ICD-10-CM | POA: Insufficient documentation

## 2014-11-07 DIAGNOSIS — R634 Abnormal weight loss: Secondary | ICD-10-CM | POA: Insufficient documentation

## 2014-11-07 DIAGNOSIS — Z3202 Encounter for pregnancy test, result negative: Secondary | ICD-10-CM | POA: Insufficient documentation

## 2014-11-07 LAB — URINALYSIS, ROUTINE W REFLEX MICROSCOPIC
GLUCOSE, UA: NEGATIVE mg/dL
Ketones, ur: NEGATIVE mg/dL
Nitrite: NEGATIVE
SPECIFIC GRAVITY, URINE: 1.02 (ref 1.005–1.030)
UROBILINOGEN UA: 0.2 mg/dL (ref 0.0–1.0)
pH: 6.5 (ref 5.0–8.0)

## 2014-11-07 LAB — COMPREHENSIVE METABOLIC PANEL
ALBUMIN: 3.9 g/dL (ref 3.5–5.0)
ALT: 12 U/L — ABNORMAL LOW (ref 14–54)
AST: 26 U/L (ref 15–41)
Alkaline Phosphatase: 85 U/L (ref 38–126)
Anion gap: 9 (ref 5–15)
BUN: 17 mg/dL (ref 6–20)
CO2: 29 mmol/L (ref 22–32)
CREATININE: 0.67 mg/dL (ref 0.44–1.00)
Calcium: 9.2 mg/dL (ref 8.9–10.3)
Chloride: 103 mmol/L (ref 101–111)
GFR calc Af Amer: >60 mL/min (ref >60)
GFR calc non Af Amer: >60 mL/min (ref >60)
GLUCOSE: 113 mg/dL — AB (ref 65–99)
POTASSIUM: 2.8 mmol/L — AB (ref 3.5–5.1)
Sodium: 141 mmol/L (ref 135–145)
TOTAL PROTEIN: 7.9 g/dL (ref 6.5–8.1)
Total Bilirubin: 0.8 mg/dL (ref 0.3–1.2)

## 2014-11-07 LAB — LIPASE, BLOOD: Lipase: <10 U/L — ABNORMAL LOW (ref 22–51)

## 2014-11-07 LAB — CBC WITH DIFFERENTIAL/PLATELET
Basophils Absolute: 0 10*3/uL (ref 0.0–0.1)
Basophils Relative: 0 % (ref 0–1)
Eosinophils Absolute: 0.2 10*3/uL (ref 0.0–0.7)
Eosinophils Relative: 2 % (ref 0–5)
HCT: 42.7 % (ref 36.0–46.0)
HEMOGLOBIN: 14.1 g/dL (ref 12.0–15.0)
Lymphocytes Relative: 16 % (ref 12–46)
Lymphs Abs: 1.8 10*3/uL (ref 0.7–4.0)
MCH: 28.2 pg (ref 26.0–34.0)
MCHC: 33 g/dL (ref 30.0–36.0)
MCV: 85.4 fL (ref 78.0–100.0)
MONOS PCT: 6 % (ref 3–12)
Monocytes Absolute: 0.6 10*3/uL (ref 0.1–1.0)
Neutro Abs: 8.4 10*3/uL — ABNORMAL HIGH (ref 1.7–7.7)
Neutrophils Relative %: 76 % (ref 43–77)
PLATELETS: 359 10*3/uL (ref 150–400)
RBC: 5 MIL/uL (ref 3.87–5.11)
RDW: 13.4 % (ref 11.5–15.5)
WBC: 11.1 10*3/uL — AB (ref 4.0–10.5)

## 2014-11-07 LAB — URINE MICROSCOPIC-ADD ON

## 2014-11-07 LAB — I-STAT CG4 LACTIC ACID, ED: LACTIC ACID, VENOUS: 0.94 mmol/L (ref 0.5–2.0)

## 2014-11-07 LAB — PREGNANCY, URINE: Preg Test, Ur: NEGATIVE

## 2014-11-07 MED ORDER — RANITIDINE HCL 150 MG PO CAPS: 150.0000 mg | ORAL_CAPSULE | Freq: Every day | ORAL | Status: DC

## 2014-11-07 MED ORDER — IOHEXOL 300 MG/ML  SOLN
80.0000 mL | Freq: Once | INTRAMUSCULAR | Status: AC | PRN
  Administered 2014-11-07: 80 mL via INTRAVENOUS

## 2014-11-07 MED ORDER — ONDANSETRON HCL 4 MG PO TABS: 4.0000 mg | ORAL_TABLET | Freq: Four times a day (QID) | ORAL | Status: DC

## 2014-11-07 MED ORDER — IOHEXOL 300 MG/ML  SOLN
25.0000 mL | Freq: Once | INTRAMUSCULAR | Status: AC | PRN
  Administered 2014-11-07: 25 mL via ORAL

## 2014-11-07 MED ORDER — SODIUM CHLORIDE 0.9 % IV BOLUS (SEPSIS)
500.0000 mL | Freq: Once | INTRAVENOUS | Status: AC
  Administered 2014-11-07: 500 mL via INTRAVENOUS

## 2014-11-07 MED ORDER — ONDANSETRON HCL 4 MG/2ML IJ SOLN
4.0000 mg | Freq: Once | INTRAMUSCULAR | Status: AC
  Administered 2014-11-07: 4 mg via INTRAVENOUS
  Filled 2014-11-07: qty 2

## 2014-11-07 NOTE — ED Notes (Signed)
Pt had lap band procedure performed about 5 years ago, post gastric bypass surgery procedure.  No known complications since.  Pt c/o nausea for past month, worsening in past few days.  Pt states she feels weak and is unable to keep anything down.

## 2014-11-07 NOTE — ED Provider Notes (Signed)
CSN: [id number]     Arrival date & time 11/07/14  0845 History   First MD Initiated Contact with Patient 11/07/14 [phone]     Chief Complaint  Patient presents with  . Nausea     (Consider location/radiation/quality/duration/timing/severity/associated sxs/prior Treatment) HPI Comments: Patient presents to the ER for evaluation of nausea, vomiting. Patient reports that symptoms have been ongoing for approximately a month. She has had significant weight loss over this period of time because she cannot eat. Patient reports that symptoms were worse in the last couple of days. She is not experiencing any abdominal pain. Patient reports a history of gastric bypass followed by lap band procedure. Lap band procedure was performed 5 years ago. She has not had any complications, but is concerned that her symptoms currently are secondary to the lap band. Surgery was performed at [facility].   Past Medical History  Diagnosis Date  . Hypothyroid   . Anemia   . Hypertension   . GERD (gastroesophageal reflux disease)    Past Surgical History  Procedure Laterality Date  . Gastric bypass    . Myomectomy     Family History  Problem Relation Age of Onset  . Hypertension Mother   . Hypertension Father    History  Substance Use Topics  . Smoking status: Never Smoker   . Smokeless tobacco: Not on file  . Alcohol Use: No   OB History    No data available     Review of Systems  Constitutional: Positive for unexpected weight change.  Gastrointestinal: Positive for nausea and vomiting.  All other systems reviewed and are negative.     Allergies  Azithromycin and Ibuprofen  Home Medications   Prior to Admission medications   Medication Sig Start Date End Date Taking? Authorizing Provider  benzonatate (TESSALON PERLES) 100 MG capsule Take 1-2 capsules (100-200 mg total) by mouth 3 (three) times daily as needed for cough. 10/24/14   Ozella Rocks, MD  chlorpheniramine-HYDROcodone [facility]  PENNKINETIC ER) 10-8 MG/5ML SUER Take 2.5-5 mLs by mouth at bedtime as needed for cough. 10/24/14   Ozella Rocks, MD  Levothyroxine Sodium (SYNTHROID PO) Take by mouth.      Historical Provider, MD  omeprazole (PRILOSEC) 40 MG capsule Take 1 capsule daily for 14 days then as needed as symptoms return. 10/24/14   Ozella Rocks, MD  triamterene-hydrochlorothiazide (DYAZIDE) 37.5-25 MG per capsule Take 1 capsule by mouth every morning.      Historical Provider, MD   BP 135/90 mmHg  Temp(Src) 98.6 F (37 C) (Oral)  Resp 16  Ht  (1.676 m)  Wt 129 lb (58.514 kg)  BMI 20.83 kg/m2  SpO2 100%  LMP 05/06/2014 Physical Exam  Constitutional: She is oriented to person, place, and time. She appears well-developed and well-nourished. No distress.  HENT:  Head: Normocephalic and atraumatic.  Right Ear: Hearing normal.  Left Ear: Hearing normal.  Nose: Nose normal.  Mouth/Throat: Oropharynx is clear and moist and mucous membranes are normal.  Eyes: Conjunctivae and EOM are normal. Pupils are equal, round, and reactive to light.  Neck: Normal range of motion. Neck supple.  Cardiovascular: Regular rhythm, S1 normal and S2 normal.  Exam reveals no gallop and no friction rub.   No murmur heard. Pulmonary/Chest: Effort normal and breath sounds normal. No respiratory distress. She exhibits no tenderness.  Abdominal: Soft. Normal appearance and bowel sounds are normal. There is no hepatosplenomegaly. There is no tenderness. There is no rebound, no  guarding, no tenderness at McBurney's point and negative Murphy's sign. No hernia.  Musculoskeletal: Normal range of motion.  Neurological: She is alert and oriented to person, place, and time. She has normal strength. No cranial nerve deficit or sensory deficit. Coordination normal. GCS eye subscore is 4. GCS verbal subscore is 5. GCS motor subscore is 6.  Skin: Skin is warm, dry and intact. No rash noted. No cyanosis.  Psychiatric: She has a normal mood  and affect. Her speech is normal and behavior is normal. Thought content normal.  Nursing note and vitals reviewed.   ED Course  Procedures (including critical care time) Labs Review Labs Reviewed  URINALYSIS, ROUTINE W REFLEX MICROSCOPIC (NOT AT [facility]) - Abnormal; Notable for the following:    Color, Urine AMBER (*)    APPearance CLOUDY (*)    Hgb urine dipstick LARGE (*)    Bilirubin Urine MODERATE (*)    Protein, ur >300 (*)    Leukocytes, UA SMALL (*)    All other components within normal limits  CBC WITH DIFFERENTIAL/PLATELET - Abnormal; Notable for the following:    WBC 11.1 (*)    Neutro Abs 8.4 (*)    All other components within normal limits  COMPREHENSIVE METABOLIC PANEL - Abnormal; Notable for the following:    Potassium 2.8 (*)    Glucose, Bld 113 (*)    ALT 12 (*)    All other components within normal limits  LIPASE, BLOOD - Abnormal; Notable for the following:    Lipase <10 (*)    All other components within normal limits  URINE MICROSCOPIC-ADD ON - Abnormal; Notable for the following:    Bacteria, UA MANY (*)    All other components within normal limits  PREGNANCY, URINE  I-STAT CG4 LACTIC ACID, ED    Imaging Review Ct Abdomen Pelvis W Contrast  11/07/2014   CLINICAL DATA:  Vomiting. Abdominal pain and weight loss. Gastric bypass surgery 2005. Placement of lap band 5 years ago.  EXAM: CT ABDOMEN AND PELVIS WITH CONTRAST  TECHNIQUE: Multidetector CT imaging of the abdomen and pelvis was performed using the standard protocol following bolus administration of intravenous contrast.  CONTRAST:  80mL OMNIPAQUE IOHEXOL 300 MG/ML SOLN, 25mL OMNIPAQUE IOHEXOL 300 MG/ML SOLN  COMPARISON:  CT abdomen and pelvis 11/17/2008  FINDINGS: Lung bases are clear without focal nodule, mass, or airspace disease. The heart size is normal. No significant pleural or pericardial effusion is present.  A lap band is in place. There is a large hernia above the lap bend. The stomach below the lab  and is unremarkable. The Roux-en-Y anastomosis is intact. Contrast can be knee seen within the proximal small bowel. There is no bowel obstruction.  The liver and spleen are within normal limits. Pancreas is within normal limits. The gallbladder is mildly distended without an obstructing lesion. The adrenal glands are normal bilaterally. The kidneys and ureters are normal.  The rectosigmoid colon is within normal limits. More proximal colon is unremarkable. The appendix is visualized and normal. The distal small bowel is unremarkable. Uterus and adnexa are within normal limits. No significant adenopathy or free fluid is present.  The bone windows are unremarkable.  IMPRESSION: 1. Large hiatal hernia above the gastric band. 2. 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 3. The remainder of the abdomen is unremarkable.   Electronically Signed   By: Marin Roberts M.D.   On: 11/07/2014 10:48     EKG Interpretation None  MDM   Final diagnoses:  Vomiting   hiatal hernia  Presents to the ER for evaluation of weight loss, nausea and vomiting. Symptoms ongoing for approximately 1 month. Patient is not experiencing any significant abdominal pain.  Examination was unremarkable. Lab work within normal limits. Patient underwent CT scan which did show a large hiatal hernia, no other acute abnormality. occasions of the lap band. No evidence of obstruction, contrast seen in the small intestine. Patient will be treated with anti-medics, ranitidine, [facility]    Gilda Creasehristopher J Pollina, MD 11/07/14 1053

## 2014-11-07 NOTE — Discharge Instructions (Signed)
Hiatal Hernia A hiatal hernia occurs when part of your stomach slides above the muscle that separates your abdomen from your chest (diaphragm). You can be born with a hiatal hernia (congenital), or it may develop over time. In almost all cases of hiatal hernia, only the top part of the stomach pushes through.  Many people have a hiatal hernia with no symptoms. The larger the hernia, the more likely that you will have symptoms. In some cases, a hiatal hernia allows stomach acid to flow back into the tube that carries food from your mouth to your stomach (esophagus). This may cause heartburn symptoms. Severe heartburn symptoms may mean you have developed a condition called gastroesophageal reflux disease (GERD).  CAUSES  Hiatal hernias are caused by a weakness in the opening (hiatus) where your esophagus passes through your diaphragm to attach to the upper part of your stomach. You may be born with a weakness in your hiatus, or a weakness can develop. RISK FACTORS Older age is a major risk factor for a hiatal hernia. Anything that increases pressure on your diaphragm can also increase your risk of a hiatal hernia. This includes:  Pregnancy.  Excess weight.  Frequent constipation. SIGNS AND SYMPTOMS  People with a hiatal hernia often have no symptoms. If symptoms develop, they are almost always caused by GERD. They may include:  Heartburn.  Belching.  Indigestion.  Trouble swallowing.  Coughing or wheezing.  Sore throat.  Hoarseness.  Chest pain. DIAGNOSIS  A hiatal hernia is sometimes found during an exam for another problem. Your health care provider may suspect a hiatal hernia if you have symptoms of GERD. Tests may be done to diagnose GERD. These may include:  X-rays of your stomach or chest.  An upper gastrointestinal (GI) series. This is an X-ray exam of your GI tract involving the use of a chalky liquid that you swallow. The liquid shows up clearly on the X-ray.  Endoscopy.  This is a procedure to look into your stomach using a thin, flexible tube that has a tiny camera and light on the end of it. TREATMENT  If you have no symptoms, you may not need treatment. If you have symptoms, treatment may include:  Dietary and lifestyle changes to help reduce GERD symptoms.  Medicines. These may include:  Over-the-counter antacids.  Medicines that make your stomach empty more quickly.  Medicines that block the production of stomach acid (H2 blockers).  Stronger medicines to reduce stomach acid (proton pump inhibitors).  You may need surgery to repair the hernia if other treatments are not helping. HOME CARE INSTRUCTIONS   Take all medicines as directed by your health care provider.  Quit smoking, if you smoke.  Try to achieve and maintain a healthy body weight.  Eat frequent small meals instead of three large meals a day. This keeps your stomach from getting too full.  Eat slowly.  Do not lie down right after eating.  Do noteat 1-2 hours before bed.   Do not drink beverages with caffeine. These include cola, coffee, cocoa, and tea.  Do not drink alcohol.  Avoid foods that can make symptoms of GERD worse. These may include:  Fatty foods.  Citrus fruits.  Other foods and drinks that contain acid.  Avoid putting pressure on your belly. Anything that puts pressure on your belly increases the amount of acid that may be pushed up into your esophagus.   Avoid bending over, especially after eating.  Raise the head of your bed   by putting blocks under the legs. This keeps your head and esophagus higher than your stomach.  Do not wear tight clothing around your chest or stomach.  Try not to strain when having a bowel movement, when urinating, or when lifting heavy objects. SEEK MEDICAL CARE IF:  Your symptoms are not controlled with medicines or lifestyle changes.  You are having trouble swallowing.  You have coughing or wheezing that will not  go away. SEEK IMMEDIATE MEDICAL CARE IF:  Your pain is getting worse.  Your pain spreads to your arms, neck, jaw, teeth, or back.  You have shortness of breath.  You sweat for no reason.  You feel sick to your stomach (nauseous) or vomit.  You vomit blood.  You have bright red blood in your stools.  You have black, tarry stools.  Document Released: 08/12/2003 Document Revised: 10/06/2013 Document Reviewed: 05/09/2013 Exit[employer] Patient Information 2015 [employer]. This information is not intended to replace advice given to you by your health care provider. Make sure you discuss any questions you have with your health care provider.  

## 2014-12-21 ENCOUNTER — Observation Stay (HOSPITAL_BASED_OUTPATIENT_CLINIC_OR_DEPARTMENT_OTHER)
Admission: EM | Admit: 2014-12-21 | Discharge: 2014-12-22 | Disposition: A | Discharge status: Home / Self Care | Payer: BLUE CROSS/BLUE SHIELD | Attending: Internal Medicine | Admitting: Internal Medicine

## 2014-12-21 ENCOUNTER — Emergency Department (HOSPITAL_BASED_OUTPATIENT_CLINIC_OR_DEPARTMENT_OTHER): Payer: BLUE CROSS/BLUE SHIELD

## 2014-12-21 ENCOUNTER — Encounter (HOSPITAL_BASED_OUTPATIENT_CLINIC_OR_DEPARTMENT_OTHER): Payer: Self-pay | Admitting: *Deleted

## 2014-12-21 DIAGNOSIS — Z79899 Other long term (current) drug therapy: Secondary | ICD-10-CM | POA: Diagnosis not present

## 2014-12-21 DIAGNOSIS — Z9884 Bariatric surgery status: Secondary | ICD-10-CM | POA: Insufficient documentation

## 2014-12-21 DIAGNOSIS — R112 Nausea with vomiting, unspecified: Secondary | ICD-10-CM | POA: Diagnosis not present

## 2014-12-21 DIAGNOSIS — Z881 Allergy status to other antibiotic agents status: Secondary | ICD-10-CM | POA: Insufficient documentation

## 2014-12-21 DIAGNOSIS — R9431 Abnormal electrocardiogram [ECG] [EKG]: Secondary | ICD-10-CM

## 2014-12-21 DIAGNOSIS — K228 Other specified diseases of esophagus: Secondary | ICD-10-CM

## 2014-12-21 DIAGNOSIS — I4581 Long QT syndrome: Secondary | ICD-10-CM | POA: Diagnosis not present

## 2014-12-21 DIAGNOSIS — K219 Gastro-esophageal reflux disease without esophagitis: Secondary | ICD-10-CM | POA: Diagnosis not present

## 2014-12-21 DIAGNOSIS — R079 Chest pain, unspecified: Secondary | ICD-10-CM | POA: Diagnosis present

## 2014-12-21 DIAGNOSIS — R1110 Vomiting, unspecified: Secondary | ICD-10-CM | POA: Diagnosis present

## 2014-12-21 DIAGNOSIS — K449 Diaphragmatic hernia without obstruction or gangrene: Secondary | ICD-10-CM | POA: Diagnosis not present

## 2014-12-21 DIAGNOSIS — E876 Hypokalemia: Secondary | ICD-10-CM | POA: Diagnosis not present

## 2014-12-21 DIAGNOSIS — D649 Anemia, unspecified: Secondary | ICD-10-CM | POA: Diagnosis not present

## 2014-12-21 DIAGNOSIS — Z886 Allergy status to analgesic agent status: Secondary | ICD-10-CM | POA: Insufficient documentation

## 2014-12-21 DIAGNOSIS — R198 Other specified symptoms and signs involving the digestive system and abdomen: Secondary | ICD-10-CM

## 2014-12-21 DIAGNOSIS — R748 Abnormal levels of other serum enzymes: Secondary | ICD-10-CM | POA: Insufficient documentation

## 2014-12-21 DIAGNOSIS — E039 Hypothyroidism, unspecified: Secondary | ICD-10-CM | POA: Diagnosis not present

## 2014-12-21 DIAGNOSIS — R09A2 Foreign body sensation, throat: Secondary | ICD-10-CM

## 2014-12-21 DIAGNOSIS — R7989 Other specified abnormal findings of blood chemistry: Secondary | ICD-10-CM | POA: Diagnosis not present

## 2014-12-21 DIAGNOSIS — F508 Other eating disorders: Secondary | ICD-10-CM

## 2014-12-21 DIAGNOSIS — I10 Essential (primary) hypertension: Secondary | ICD-10-CM | POA: Diagnosis not present

## 2014-12-21 DIAGNOSIS — R778 Other specified abnormalities of plasma proteins: Secondary | ICD-10-CM

## 2014-12-21 LAB — LIPASE, BLOOD: LIPASE: 10 U/L — AB (ref 22–51)

## 2014-12-21 LAB — CBC
HCT: 39 % (ref 36.0–46.0)
HEMOGLOBIN: 12.6 g/dL (ref 12.0–15.0)
MCH: 28.3 pg (ref 26.0–34.0)
MCHC: 32.3 g/dL (ref 30.0–36.0)
MCV: 87.6 fL (ref 78.0–100.0)
PLATELETS: 412 10*3/uL — AB (ref 150–400)
RBC: 4.45 MIL/uL (ref 3.87–5.11)
RDW: 14.6 % (ref 11.5–15.5)
WBC: 10.2 10*3/uL (ref 4.0–10.5)

## 2014-12-21 LAB — CBC WITH DIFFERENTIAL/PLATELET
BASOS ABS: 0 10*3/uL (ref 0.0–0.1)
Basophils Relative: 0 % (ref 0–1)
Eosinophils Absolute: 0.1 10*3/uL (ref 0.0–0.7)
Eosinophils Relative: 1 % (ref 0–5)
HEMATOCRIT: 38.8 % (ref 36.0–46.0)
HEMOGLOBIN: 13 g/dL (ref 12.0–15.0)
LYMPHS ABS: 1.5 10*3/uL (ref 0.7–4.0)
Lymphocytes Relative: 13 % (ref 12–46)
MCH: 28.5 pg (ref 26.0–34.0)
MCHC: 33.5 g/dL (ref 30.0–36.0)
MCV: 85.1 fL (ref 78.0–100.0)
MONO ABS: 0.7 10*3/uL (ref 0.1–1.0)
Monocytes Relative: 6 % (ref 3–12)
NEUTROS ABS: 9.6 10*3/uL — AB (ref 1.7–7.7)
NEUTROS PCT: 80 % — AB (ref 43–77)
Platelets: 407 10*3/uL — ABNORMAL HIGH (ref 150–400)
RBC: 4.56 MIL/uL (ref 3.87–5.11)
RDW: 14.3 % (ref 11.5–15.5)
WBC: 11.9 10*3/uL — ABNORMAL HIGH (ref 4.0–10.5)

## 2014-12-21 LAB — CREATININE, SERUM
Creatinine, Ser: 0.59 mg/dL (ref 0.44–1.00)
GFR calc Af Amer: >60 mL/min (ref >60)
GFR calc non Af Amer: >60 mL/min (ref >60)

## 2014-12-21 LAB — COMPREHENSIVE METABOLIC PANEL
ALBUMIN: 3.1 g/dL — AB (ref 3.5–5.0)
ALK PHOS: 73 U/L (ref 38–126)
ALT: 11 U/L — ABNORMAL LOW (ref 14–54)
AST: 20 U/L (ref 15–41)
Anion gap: 12 (ref 5–15)
BUN: 7 mg/dL (ref 6–20)
CHLORIDE: 101 mmol/L (ref 101–111)
CO2: 30 mmol/L (ref 22–32)
Calcium: 9.1 mg/dL (ref 8.9–10.3)
Creatinine, Ser: 0.49 mg/dL (ref 0.44–1.00)
GFR calc Af Amer: >60 mL/min (ref >60)
GFR calc non Af Amer: >60 mL/min (ref >60)
Glucose, Bld: 100 mg/dL — ABNORMAL HIGH (ref 65–99)
Potassium: 3 mmol/L — ABNORMAL LOW (ref 3.5–5.1)
Sodium: 143 mmol/L (ref 135–145)
Total Bilirubin: 0.8 mg/dL (ref 0.3–1.2)
Total Protein: 7.2 g/dL (ref 6.5–8.1)

## 2014-12-21 LAB — MAGNESIUM: MAGNESIUM: 2 mg/dL (ref 1.7–2.4)

## 2014-12-21 LAB — TROPONIN I
TROPONIN I: 0.06 ng/mL — AB (ref <0.031)
Troponin I: <0.03 ng/mL (ref <0.031)

## 2014-12-21 MED ORDER — FAMOTIDINE 20 MG PO TABS
20.0000 mg | ORAL_TABLET | Freq: Two times a day (BID) | ORAL | Status: DC | PRN
  Administered 2014-12-21 – 2014-12-22 (×2): 20 mg via ORAL
  Filled 2014-12-21 (×2): qty 1

## 2014-12-21 MED ORDER — ENOXAPARIN SODIUM 40 MG/0.4ML ~~LOC~~ SOLN
40.0000 mg | SUBCUTANEOUS | Status: DC
  Administered 2014-12-21: 40 mg via SUBCUTANEOUS
  Filled 2014-12-21: qty 0.4

## 2014-12-21 MED ORDER — ONDANSETRON HCL 4 MG/2ML IJ SOLN
4.0000 mg | Freq: Once | INTRAMUSCULAR | Status: AC
  Administered 2014-12-21: 4 mg via INTRAVENOUS
  Filled 2014-12-21: qty 2

## 2014-12-21 MED ORDER — POTASSIUM CHLORIDE 20 MEQ/15ML (10%) PO SOLN
40.0000 meq | ORAL | Status: AC
  Administered 2014-12-21 (×2): 40 meq via ORAL
  Filled 2014-12-21 (×2): qty 30

## 2014-12-21 MED ORDER — SODIUM CHLORIDE 0.9 % IV BOLUS (SEPSIS)
1000.0000 mL | INTRAVENOUS | Status: AC
Start: 2014-12-21 — End: 2014-12-21
  Administered 2014-12-21: 1000 mL via INTRAVENOUS

## 2014-12-21 MED ORDER — SODIUM CHLORIDE 0.9 % IV SOLN
INTRAVENOUS | Status: AC
  Administered 2014-12-21: 15:00:00 via INTRAVENOUS

## 2014-12-21 MED ORDER — SODIUM CHLORIDE 0.9 % IV SOLN
INTRAVENOUS | Status: DC
  Administered 2014-12-21 – 2014-12-22 (×2): via INTRAVENOUS

## 2014-12-21 MED ORDER — LEVOTHYROXINE SODIUM 112 MCG PO TABS: 112.0000 ug | ORAL_TABLET | Freq: Every day | ORAL | Status: DC

## 2014-12-21 MED ORDER — ACETAMINOPHEN 325 MG PO TABS
650.0000 mg | ORAL_TABLET | Freq: Four times a day (QID) | ORAL | Status: DC | PRN
  Administered 2014-12-21: 650 mg via ORAL
  Filled 2014-12-21: qty 2

## 2014-12-21 MED ORDER — GI COCKTAIL ~~LOC~~
30.0000 mL | Freq: Once | ORAL | Status: AC
  Administered 2014-12-21: 30 mL via ORAL
  Filled 2014-12-21: qty 30

## 2014-12-21 NOTE — Progress Notes (Signed)
Called by EDP for transfer of this patient. Presented with a "knot" in her chest and a foreign body sensation after eating baked chicken 2 days ago. Has been unable to tolerate POs since. Has a h/o lap band procedure approx 5 years ago. Upon w/u in the ED found to have a troponin of 0.06 and a K of 3.0. EKG is non-acute. HEART score is 3. Do not believe she needs admission for ACS rule out (Dr. Myrtis SerKatz was consulted by phone and agrees), but do believe she needs her lap band adjusted and have requested EDP confer with surgery to see if this can be done here. Dr. Johna SheriffHoxworth says it can be done but would [facility]. Will accept patient in transfer.  Peggye PittEstela Hernandez, MD [facility] Pager: [phone]

## 2014-12-21 NOTE — ED Provider Notes (Signed)
CSN: [id number]     Arrival date & time 12/21/14  0703 History   First MD Initiated Contact with Patient 12/21/14 [id number]     Chief Complaint  Patient presents with  . Gastrophageal Reflux     (Consider location/radiation/quality/duration/timing/severity/associated sxs/prior Treatment) Patient is a 49 y.o. female presenting with GERD. The history is provided by the patient.  Gastrophageal Reflux This is a chronic problem. The current episode started 2 days ago. The problem occurs constantly. The problem has not changed since onset.Pertinent negatives include no chest pain, no abdominal pain, no headaches and no shortness of breath. Exacerbated by: off her H2 blocker and PPI for 1 week. Nothing relieves the symptoms. She has tried nothing for the symptoms. The treatment provided no relief.    Past Medical History  Diagnosis Date  . Hypothyroid   . Anemia   . Hypertension   . GERD (gastroesophageal reflux disease)    Past Surgical History  Procedure Laterality Date  . Gastric bypass    . Myomectomy     Family History  Problem Relation Age of Onset  . Hypertension Mother   . Hypertension Father    History  Substance Use Topics  . Smoking status: Never Smoker   . Smokeless tobacco: Not on file  . Alcohol Use: No   OB History    No data available     Review of Systems  Constitutional: Negative for fever and fatigue.  HENT: Negative for congestion and drooling.   Eyes: Negative for pain.  Respiratory: Negative for cough and shortness of breath.   Cardiovascular: Negative for chest pain.  Gastrointestinal: Positive for nausea and vomiting. Negative for abdominal pain and diarrhea.  Genitourinary: Negative for dysuria and hematuria.  Musculoskeletal: Negative for back pain, gait problem and neck pain.  Skin: Negative for color change.  Neurological: Negative for dizziness and headaches.  Hematological: Negative for adenopathy.  Psychiatric/Behavioral: Negative for  behavioral problems.  All other systems reviewed and are negative.     Allergies  Azithromycin and Ibuprofen  Home Medications   Prior to Admission medications   Medication Sig Start Date End Date Taking? Authorizing Provider  Levothyroxine Sodium (SYNTHROID PO) Take by mouth.      Historical Provider, MD  triamterene-hydrochlorothiazide (DYAZIDE) 37.5-25 MG per capsule Take 1 capsule by mouth every morning.      Historical Provider, MD   BP 115/73 mmHg  Pulse 112  Temp(Src) 98 F (36.7 C) (Oral)  Resp 16  Ht 5\' 6"  (1.676 m)  Wt 118 lb 6.4 oz (53.706 kg)  BMI 19.12 kg/m2  SpO2 99%  LMP 05/06/2014 Physical Exam  Constitutional: She is oriented to person, place, and time. She appears well-developed and well-nourished.  HENT:  Head: Normocephalic.  Mouth/Throat: Oropharynx is clear and moist. No oropharyngeal exudate.  Eyes: Conjunctivae and EOM are normal. Pupils are equal, round, and reactive to light.  Neck: Normal range of motion. Neck supple.  Cardiovascular: Normal rate, regular rhythm, normal heart sounds and intact distal pulses.  Exam reveals no gallop and no friction rub.   No murmur heard. Pulmonary/Chest: Effort normal and breath sounds normal. No respiratory distress. She has no wheezes.  Abdominal: Soft. Bowel sounds are normal. There is no tenderness. There is no rebound and no guarding.  Musculoskeletal: Normal range of motion. She exhibits no edema or tenderness.  Neurological: She is alert and oriented to person, place, and time.  Skin: Skin is warm and dry.  Psychiatric: She has a  normal mood and affect. Her behavior is normal.  Nursing note and vitals reviewed.   ED Course  Procedures (including critical care time) Labs Review Labs Reviewed  CBC WITH DIFFERENTIAL/PLATELET - Abnormal; Notable for the following:    WBC 11.9 (*)    Platelets 407 (*)    Neutrophils Relative % 80 (*)    Neutro Abs 9.6 (*)    All other components within normal limits   COMPREHENSIVE METABOLIC PANEL - Abnormal; Notable for the following:    Potassium 3.0 (*)    Glucose, Bld 100 (*)    Albumin 3.1 (*)    ALT 11 (*)    All other components within normal limits  TROPONIN I - Abnormal; Notable for the following:    Troponin I 0.06 (*)    All other components within normal limits  LIPASE, BLOOD - Abnormal; Notable for the following:    Lipase 10 (*)    All other components within normal limits    Imaging Review Dg Chest 2 View  12/21/2014   CLINICAL DATA:  Two day history of difficulty swallowing, symptoms are progressive, possible knee impaction in the esophagus; history of gastric bypass and reflux  EXAM: CHEST  2 VIEW  COMPARISON:  PA and lateral chest x-ray of May 07, 2015 and abdominal films of today's date  FINDINGS: The lungs are adequately inflated and clear. The heart is normal in size. The mediastinum is normal in width. There is no pleural effusion. There is a inflatable ring at the GE junction. The bony thorax is unremarkable.  IMPRESSION: Mild hyperinflation which may refer reflect reactive airway disease or deep inspiration. There is no acute cardiopulmonary abnormality.   Electronically Signed   By: David  SwazilandJordan M.D.   On: 12/21/2014 07:42   Dg Abd 1 View  12/21/2014   CLINICAL DATA:  Difficulty swallowing for the past few days, history of gastric bypass and placement of an anti reflux inflatable ring at the GE junction.  EXAM: ABDOMEN - 1 VIEW  COMPARISON:  CT scan of the abdomen and pelvis of November 07, 2014  FINDINGS: A known moderate-sized hiatal hernia above the inflatable gastric ring demonstrated on the previous study is not evident today. The radiodense ring appears to be in reasonable position. The distal esophagus appears to be of normal caliber. There is no small or large bowel obstructive pattern. The colonic stool burden is mildly increased. There are radiodensities that project in the right upper quadrant likely related to costal  cartilages. The bony structures are unremarkable.  IMPRESSION: There is no evidence of a hiatal hernia nor small or large bowel obstruction. The inflatable gastric band appears to be in reasonable position. Moderately increased colonic stool burden may reflect clinical constipation in the appropriate setting.   Electronically Signed   By: David  [place]ilandJordan M.D.   On: 12/21/2014 07:45     EKG Interpretation   Date/Time:  Monday December 21 2014 07:51:52 EDT Ventricular Rate:  90 PR Interval:  134 QRS Duration: 94 QT Interval:  386 QTC Calculation: 472 R Axis:   79 Text Interpretation:  Unusual P axis and short PR, probable junctional  tachycardia Confirmed by Garnett Nunziata  MD, Deleon Passe ([id number]) on 12/21/2014 7:54:57  AM      MDM   Final diagnoses:  Sensation of foreign body in esophagus  Elevated troponin  Hypokalemia    7:31 AM 49 y.o. female  With a history of GERD , status post gastric bypass and then Lap  band about 5 years ago who presents with foreign body sensation in her esophagus. She states that she was eating baked chicken 2 days ago for breakfast when she began feeling the sensation. She notes that she regurgitated some of the chicken but has continued to feel a foreign body sensation in her distal esophagus. She notes that she is able to tolerate small amounts of food and water but anything more than a small amount she vomits. She denies any fevers. She is afebrile and mildly tachycardic here. Vital signs otherwise unremarkable. She was seen here little over one month ago and had a CT scan showing a large hiatal hernia above the lap band. She is not followed up with her surgeon as requested. We'll get screening labs and imaging here.   Of note the patient has also been off her PPI and H2 blocker for the last week.  10:43 AM found to have mildly elevated troponin. Case discussed with Dr. Myrtis Ser with cardiology who agrees this is likely not cardiac. Cardiology does not need to be involved  unless the admitting team feels it is necessary. I discussed the case with Dr. Ardyth Harps (hospitalist) who asked me to consult surgery to see if they could adjust the LAP-BAND. I spoke with Dr. Johna Sheriff, he is in surgery at [facility] today. They do have the ability to adjust the LAP-BAND. He will need to be contacted once the patient arrives at the hospital. The patient is not tolerating po here.    Purvis Sheffield, MD 12/21/14 1623

## 2014-12-21 NOTE — ED Notes (Signed)
Pt reports hx of GERD.  States that for the past 2 days she has had increased difficulty with swallowing and states that she was unable to go to work today due to the pain.

## 2014-12-21 NOTE — H&P (Signed)
[facility] History and Physical  TAYONNA BACHA ZOX:[id number] DOB: 1966-05-12 DOA: 12/21/2014   PCP: Gwynneth Aliment, MD    Chief Complaint: chest pain  HPI: [name] is a 49 y.o. 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 She ate ribs at a cook out the night it started.  She has had 2 months of nausea which is near constant and vomiting of liquids and solids. She is occasionally able to keep down food therefore it is not intractable vomiting. She has had weight loss of about 15 lb in one month. She is having heartburn, constant cough at night and throat hurting from the vomiting.  ER started Omeprazole and something starting with an R about a month ago - she stopped taking it after 7 days as they were causing increased nausea.  Troponin found to be elevated - no cardiac history. She was transferred to [facility] per surgeon's request.    General: The patient denies anorexia, fever, weight loss - + generalized weakness  Cardiac: Denies chest pain, syncope, palpitations, pedal edema  Respiratory: Denies shortness of breath, occassinal wheezing after coughing at night GI: Denies severe indigestion/heartburn, abdominal pain, nausea, vomiting, diarrhea and constipation- normal BM yesterday GU: Denies hematuria, incontinence, dysuria - Urine dark and decreased in amount  Musculoskeletal: chronic knee pain  Skin: Denies suspicious skin lesions Neurologic: Denies focal weakness or numbness, change in vision Psychiatry: Denies depression or anxiety. Hematologic: no easy bruising or bleeding  All other systems reviewed and found to be negative.  Past Medical History  Diagnosis Date  . Hypothyroid   . Anemia   . Hypertension   . GERD (gastroesophageal reflux disease)     Past Surgical History  Procedure  Laterality Date  . Gastric bypass    . 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 Azithromycin Nausea And Vomiting    Pt with abd pain, N and V  . Ibuprofen     Family history:   Family History  Problem Relation Age of Onset  . Hypertension Mother   . Hypertension Father      Prior to Admission medications   Medication Sig Start Date End Date Taking? Authorizing Provider  SYNTHROID 112 MCG tablet Take 1 tablet by mouth daily at 12 noon. 10/26/14  Yes Historical Provider, MD  triamterene-hydrochlorothiazide (DYAZIDE) 37.5-25 MG per capsule Take 1 capsule by mouth every morning.     Yes Historical Provider, MD     Physical Exam: Filed Vitals:   12/21/14 1245 12/21/14 1312 12/21/14 1400 12/21/14 1425  BP: 115/69 98/52  112/65  Pulse:  88  92  Temp:  97.6 F (36.4 C)  98.8 F (37.1 C)  TempSrc:  Oral  Oral  Resp: 13 18  18   Height:   5\' 6"  (1.676 m)   Weight:   53.706 kg (118 lb 6.4 oz)   SpO2: 100% 100%  98%     General: AAO x3, no distress HEENT: Normocephalic and Atraumatic, Mucous membranes pink                PERRLA; EOM intact; No scleral icterus,  Nares: Patent, Oropharynx: Clear, Fair Dentition                 Neck: FROM, no cervical lymphadenopathy, thyromegaly, carotid bruit or JVD;  Breasts: deferred CHEST WALL: No tenderness  CHEST: Normal respiration, clear to auscultation bilaterally  HEART: Regular rate and rhythm; no murmurs rubs or gallops  BACK: No kyphosis or scoliosis; no CVA tenderness  GI: Positive Bowel Sounds, soft, non-tender; no masses, no organomegaly Rectal Exam: deferred MSK: No cyanosis, clubbing, or edema Genitalia: not examined  SKIN:  no rash or ulceration  CNS: Alert and Oriented x 4, Nonfocal exam, CN 2-12 intact  Labs on Admission:  Basic Metabolic Panel:  Recent Labs Lab 12/21/14 0800  NA 143  K  3.0*  CL 101  CO2 30  GLUCOSE 100*  BUN 7  CREATININE 0.49  CALCIUM 9.1   Liver Function Tests:  Recent Labs Lab 12/21/14 0800  AST 20  ALT 11*  ALKPHOS 73  BILITOT 0.8  PROT 7.2  ALBUMIN 3.1*    Recent Labs Lab 12/21/14 0800  LIPASE 10*   No results for input(s): AMMONIA in the last 168 hours. CBC:  Recent Labs Lab 12/21/14 0800  WBC 11.9*  NEUTROABS 9.6*  HGB 13.0  HCT 38.8  MCV 85.1  PLT 407*   Cardiac Enzymes:  Recent Labs Lab 12/21/14 0800  TROPONINI 0.06*    BNP (last 3 results) No results for input(s): BNP in the last 8760 hours.  ProBNP (last 3 results) No results for input(s): PROBNP in the last 8760 hours.  CBG: No results for input(s): GLUCAP in the last 168 hours.  Radiological Exams on Admission: Dg Chest 2 View  12/21/2014   CLINICAL DATA:  Two day history of difficulty swallowing, symptoms are progressive, possible knee impaction in the esophagus; history of gastric bypass and reflux  EXAM: CHEST  2 VIEW  COMPARISON:  PA and lateral chest x-ray of May 07, 2015 and abdominal films of today's date  FINDINGS: The lungs are adequately inflated and clear. The heart is normal in size. The mediastinum is normal in width. There is no pleural effusion. There is a inflatable ring at the GE junction. The bony thorax is unremarkable.  IMPRESSION: Mild hyperinflation which may refer reflect reactive airway disease or deep inspiration. There is no acute cardiopulmonary abnormality.   Electronically Signed   By: David  [place] M.D.   On: 12/21/2014 07:42   Dg Abd 1 View  12/21/2014   CLINICAL DATA:  Difficulty swallowing for the past few days, history of gastric bypass and placement of an anti reflux inflatable ring at the GE junction.  EXAM: ABDOMEN - 1 VIEW  COMPARISON:  CT scan of the abdomen and pelvis of November 07, 2014  FINDINGS: A known moderate-sized hiatal hernia above the inflatable gastric ring demonstrated on the previous study is not evident  today. The radiodense ring appears to be in reasonable position. The distal esophagus appears to be of normal caliber. There is no small or large bowel obstructive pattern. The colonic stool burden is mildly increased. There are radiodensities that project in the right upper quadrant likely related to costal cartilages. The bony structures are unremarkable.  IMPRESSION: There is no evidence of a hiatal hernia nor small or large bowel obstruction. The inflatable gastric band appears to be in reasonable position. Moderately increased colonic stool burden may reflect clinical constipation in the appropriate setting.   Electronically Signed   By: David  [place]  M.D.   On: 12/21/2014 07:45    EKG: Independently reviewed. Sinus rhythm- no st- t wave changes QT 523  Assessment/Plan Principal Problem:   Elevated troponin - EKG unrevealing - likely GI rather than cardiac- cased [facility]ology, Dr Myrtis SerKatz, who agrees- will check 1 more set of Troponin and if not rising, no need to recheck    Active Problems:    Vomiting/ weight loss - has not seen surgeon in close to 1 yr due to insurance issues and has not had lap band adjusted - has an appt with Dr Lily PeerFernandez in Aug to have lap band adjust but symptoms are quite severe and patient does not want to wait until then -  Surgery consulted- Dr Johna SheriffHoxworth [facility] - she is insisting on eating a little- have started clear liquids     Hypokalemia/ Prolonged Q-T interval on ECG - due to poor PO intake/ vomiting ? Also on HCTZ - replace K , check Mg- follow on telemetry  HTN- currently slightly hypotensive - hold Triam/ HCTZ  Hypothyroid - was able to take Synthroid- this AM- continue    Consulted:   Code Status: Full code   Family Communication:   DVT Prophylaxis:Lovenox  Time spent: 50 min  Zabdi Mis, MD [facility]  If 7PM-7AM, please contact  night-coverage [email or web address] 12/21/2014, 3:53 PM

## 2014-12-21 NOTE — Consult Note (Signed)
Reason for Consult:  Gastric band Referring Physician: Rizwan,s Gastric Band:  Dr. Toney Rakes -  [facility] [name] is an 49 y.o. female.   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 Toney Rakes.   She presented to [place] this AM with pain and a severe knot in her chest.  She has had a feeling like a foreign body in her chest since eating some meat  2 days ago.  She reports 2 months of nausea and ongoing vomiting with liquids and solids.  She reports 15 pound weight loss in 1 month, ongoing heartburn, cough at night and sore throat from vomiting.   Work up shows she is afebrile, troponin was 0.06, K+ 3.0 and WBC 11.9.  Plain film shows:  There is no evidence of a hiatal hernia nor small or large bowel obstruction. The inflatable gastric band appears to be in reasonable position. Moderately increased colonic stool burden may reflect clinical constipation in the appropriate setting.  She is unable to get into see [facility] surgeon and came to the ED for evaluation.  She had lost her insurance so her band has not been adjusted for some time.  She reports her weight is currently down to 118 pounds, she was 367 when she had her Roux placed in 2005.  We are ask to see.    Past Medical History  Diagnosis Date  . Hypothyroid   . Anemia   . Hypertension   . GERD (gastroesophageal reflux disease)     Past Surgical History  Procedure Laterality Date  . Roux-en-Y   2005 Dr. Excell Seltzer Gastric Band 2008 Dr. Loren [facility] [place]     . Myomectomy      Family History  Problem Relation Age of Onset  . Hypertension Mother   . Hypertension Father     Social History:  reports that she has never smoked. She does not have any smokeless tobacco history on file. She reports that she does not drink alcohol or use illicit drugs.  Allergies:  Allergies  Allergen Reactions  .  Azithromycin Nausea And Vomiting    Pt with abd pain, N and V  . Ibuprofen     Medications:  Prior to Admission:  Prescriptions prior to admission  Medication Sig Dispense Refill Last Dose  . SYNTHROID 112 MCG tablet Take 1 tablet by mouth daily at 12 noon.  1 12/21/2014 at Unknown time  . triamterene-hydrochlorothiazide (DYAZIDE) 37.5-25 MG per capsule Take 1 capsule by mouth every morning.     12/21/2014 at Unknown time   Scheduled: . sodium chloride   Intravenous STAT  . enoxaparin (LOVENOX) injection  40 mg Subcutaneous Q24H  . [START ON 12/22/2014] levothyroxine  112 mcg Oral Q1200  . potassium chloride  40 mEq Oral Q4H   Continuous: . sodium chloride     PRN: Anti-infectives    None      Results for orders placed or performed during the hospital encounter of 12/21/14 (from the past 48 hour(s))  CBC with Differential/Platelet     Status: Abnormal   Collection Time: 12/21/14  8:00 AM  Result Value Ref Range   WBC 11.9 (H) 4.0 - 10.5 K/uL   RBC 4.56 3.87 - 5.11 MIL/uL   Hemoglobin 13.0 12.0 - 15.0 g/dL   HCT 38.8 36.0 - 46.0 %   MCV 85.1 78.0 -  100.0 fL   MCH 28.5 26.0 - 34.0 pg   MCHC 33.5 30.0 - 36.0 g/dL   RDW 14.3 11.5 - 15.5 %   Platelets 407 (H) 150 - 400 K/uL   Neutrophils Relative % 80 (H) 43 - 77 %   Lymphocytes Relative 13 12 - 46 %   Monocytes Relative 6 3 - 12 %   Eosinophils Relative 1 0 - 5 %   Basophils Relative 0 0 - 1 %   Neutro Abs 9.6 (H) 1.7 - 7.7 K/uL   Lymphs Abs 1.5 0.7 - 4.0 K/uL   Monocytes Absolute 0.7 0.1 - 1.0 K/uL   Eosinophils Absolute 0.1 0.0 - 0.7 K/uL   Basophils Absolute 0.0 0.0 - 0.1 K/uL   Smear Review MORPHOLOGY UNREMARKABLE   Comprehensive metabolic panel     Status: Abnormal   Collection Time: 12/21/14  8:00 AM  Result Value Ref Range   Sodium 143 135 - 145 mmol/L   Potassium 3.0 (L) 3.5 - 5.1 mmol/L   Chloride 101 101 - 111 mmol/L   CO2 30 22 - 32 mmol/L   Glucose, Bld 100 (H) 65 - 99 mg/dL   BUN 7 6 - 20 mg/dL    Creatinine, Ser 0.49 0.44 - 1.00 mg/dL   Calcium 9.1 8.9 - 10.3 mg/dL   Total Protein 7.2 6.5 - 8.1 g/dL   Albumin 3.1 (L) 3.5 - 5.0 g/dL   AST 20 15 - 41 U/L   ALT 11 (L) 14 - 54 U/L   Alkaline Phosphatase 73 38 - 126 U/L   Total Bilirubin 0.8 0.3 - 1.2 mg/dL   GFR calc non Af Amer >60 >60 mL/min   GFR calc Af Amer >60 >60 mL/min    Comment: (NOTE) The eGFR has been calculated using the CKD EPI equation. This calculation has not been validated in all clinical situations. eGFR's persistently <60 mL/min signify possible Chronic Kidney Disease.    Anion gap 12 5 - 15  Troponin I     Status: Abnormal   Collection Time: 12/21/14  8:00 AM  Result Value Ref Range   Troponin I 0.06 (H) <0.031 ng/mL    Comment:        PERSISTENTLY INCREASED TROPONIN VALUES IN THE RANGE OF 0.04-0.49 ng/mL CAN BE SEEN IN:       -UNSTABLE ANGINA       -CONGESTIVE HEART FAILURE       -MYOCARDITIS       -CHEST TRAUMA       -ARRYHTHMIAS       -LATE PRESENTING MYOCARDIAL INFARCTION       -COPD   CLINICAL FOLLOW-UP RECOMMENDED.   Lipase, blood     Status: Abnormal   Collection Time: 12/21/14  8:00 AM  Result Value Ref Range   Lipase 10 (L) 22 - 51 U/L    Dg Chest 2 View  12/21/2014   CLINICAL DATA:  Two day history of difficulty swallowing, symptoms are progressive, possible knee impaction in the esophagus; history of gastric bypass and reflux  EXAM: CHEST  2 VIEW  COMPARISON:  PA and lateral chest x-ray of May 07, 2015 and abdominal films of today's date  FINDINGS: The lungs are adequately inflated and clear. The heart is normal in size. The mediastinum is normal in width. There is no pleural effusion. There is a inflatable ring at the GE junction. The bony thorax is unremarkable.  IMPRESSION: Mild hyperinflation which may refer reflect reactive airway disease  or deep inspiration. There is no acute cardiopulmonary abnormality.   Electronically Signed   By: David  Martinique M.D.   On: 12/21/2014 07:42    Dg Abd 1 View  12/21/2014   CLINICAL DATA:  Difficulty swallowing for the past few days, history of gastric bypass and placement of an anti reflux inflatable ring at the GE junction.  EXAM: ABDOMEN - 1 VIEW  COMPARISON:  CT scan of the abdomen and pelvis of November 07, 2014  FINDINGS: A known moderate-sized hiatal hernia above the inflatable gastric ring demonstrated on the previous study is not evident today. The radiodense ring appears to be in reasonable position. The distal esophagus appears to be of normal caliber. There is no small or large bowel obstructive pattern. The colonic stool burden is mildly increased. There are radiodensities that project in the right upper quadrant likely related to costal cartilages. The bony structures are unremarkable.  IMPRESSION: There is no evidence of a hiatal hernia nor small or large bowel obstruction. The inflatable gastric band appears to be in reasonable position. Moderately increased colonic stool burden may reflect clinical constipation in the appropriate setting.   Electronically Signed   By: David  Martinique M.D.   On: 12/21/2014 07:45    Review of Systems  Constitutional: Weight loss: 15 pounds last month.  HENT: Negative.   Eyes: Negative.   Respiratory: Negative.   Cardiovascular: Negative.   Gastrointestinal: Positive for heartburn, nausea and vomiting. Negative for abdominal pain, diarrhea, constipation, blood in stool and melena.  Genitourinary: Negative.   Musculoskeletal: Negative.   Skin: Negative.   Neurological: Negative.   Endo/Heme/Allergies: Negative.   Psychiatric/Behavioral: Negative.    Blood pressure 112/65, pulse 92, temperature 98.8 F (37.1 C), temperature source Oral, resp. rate 18, height 5' 6"  (1.676 m), weight 53.706 kg (118 lb 6.4 oz), last menstrual period 05/06/2014, SpO2 98 %. Physical Exam  Constitutional: She is oriented to person, place, and time. No distress.  Thin cachectic female no acute distress   HENT:   Head: Normocephalic and atraumatic.  Nose: Nose normal.  Eyes: Conjunctivae and EOM are normal. Right eye exhibits no discharge. Left eye exhibits no discharge. No scleral icterus.  Neck: Normal range of motion. Neck supple. No JVD present. No tracheal deviation present. No thyromegaly present.  Cardiovascular: Normal rate, regular rhythm, normal heart sounds and intact distal pulses.   No murmur heard. Respiratory: Effort normal. No respiratory distress. She has no wheezes. She exhibits no tenderness.  GI: Soft. Bowel sounds are normal. She exhibits no distension and no mass. There is no tenderness. There is no rebound and no guarding.  Lap band port right abdomen  Musculoskeletal: She exhibits no edema.  Neurological: She is alert and oriented to person, place, and time. No cranial nerve deficit.  Skin: Skin is warm and dry. She is not diaphoretic. No erythema.  Psychiatric: She has a normal mood and affect. Her behavior is normal. Judgment and thought content normal.    Assessment/Plan: Roux-en-Y and gastric banding with trouble swallowing, nausea and vomiting Weight loss Large hiatal hernia above the lap band Positive Troponin Hypothyroid Hypertension   Plan:  Dr. Excell Seltzer has seen her and removed 6 ml of fluid from the gastric band.  We will let her try clears tonight and see how she does.  Admitted by Medicine for elevated troponin.  We will follow.  She has an appointment with [place] Aug. 9, 2016.  She has eaten most of her tray of  clears since we took fluid out of cuff and she notes marked improvement.   [name] 12/21/2014, 4:27 PM

## 2014-12-22 DIAGNOSIS — I4581 Long QT syndrome: Secondary | ICD-10-CM

## 2014-12-22 DIAGNOSIS — D649 Anemia, unspecified: Secondary | ICD-10-CM | POA: Diagnosis not present

## 2014-12-22 DIAGNOSIS — K219 Gastro-esophageal reflux disease without esophagitis: Secondary | ICD-10-CM | POA: Diagnosis not present

## 2014-12-22 DIAGNOSIS — E876 Hypokalemia: Secondary | ICD-10-CM | POA: Diagnosis not present

## 2014-12-22 DIAGNOSIS — R079 Chest pain, unspecified: Secondary | ICD-10-CM | POA: Diagnosis not present

## 2014-12-22 DIAGNOSIS — R7989 Other specified abnormal findings of blood chemistry: Secondary | ICD-10-CM | POA: Diagnosis not present

## 2014-12-22 DIAGNOSIS — E039 Hypothyroidism, unspecified: Secondary | ICD-10-CM | POA: Diagnosis not present

## 2014-12-22 DIAGNOSIS — R112 Nausea with vomiting, unspecified: Secondary | ICD-10-CM | POA: Diagnosis not present

## 2014-12-22 LAB — BASIC METABOLIC PANEL
Anion gap: 4 — ABNORMAL LOW (ref 5–15)
CO2: 28 mmol/L (ref 22–32)
Calcium: 8.2 mg/dL — ABNORMAL LOW (ref 8.9–10.3)
Chloride: 106 mmol/L (ref 101–111)
Creatinine, Ser: 0.53 mg/dL (ref 0.44–1.00)
GFR calc Af Amer: >60 mL/min (ref >60)
GFR calc non Af Amer: >60 mL/min (ref >60)
Glucose, Bld: 84 mg/dL (ref 65–99)
POTASSIUM: 3.9 mmol/L (ref 3.5–5.1)
Sodium: 138 mmol/L (ref 135–145)

## 2014-12-22 LAB — CBC
HCT: 32.9 % — ABNORMAL LOW (ref 36.0–46.0)
Hemoglobin: 10.6 g/dL — ABNORMAL LOW (ref 12.0–15.0)
MCH: 28.2 pg (ref 26.0–34.0)
MCHC: 32.2 g/dL (ref 30.0–36.0)
MCV: 87.5 fL (ref 78.0–100.0)
Platelets: 329 10*3/uL (ref 150–400)
RBC: 3.76 MIL/uL — ABNORMAL LOW (ref 3.87–5.11)
RDW: 14.6 % (ref 11.5–15.5)
WBC: 8 10*3/uL (ref 4.0–10.5)

## 2014-12-22 MED ORDER — FAMOTIDINE 20 MG PO TABS: 20.0000 mg | ORAL_TABLET | Freq: Two times a day (BID) | ORAL | Status: AC | PRN

## 2014-12-22 NOTE — Progress Notes (Signed)
[facility] Progress Note     Subjective: Patient much improved.  No more N/V, tolerating solid food.  She's happy she's improving.  She says she has an appt with [facility] in August.    Objective: Vital signs in last 24 hours: Temp:  [97.6 F (36.4 C)-98.8 F (37.1 C)] 98 F (36.7 C) (07/19 0511) Pulse Rate:  [69-92] 78 (07/19 0511) Resp:  [13-18] 18 (07/19 0511) BP: (98-115)/(52-69) 106/61 mmHg (07/19 0511) SpO2:  [98 %-100 %] 98 % (07/19 0511) Weight:  [53.706 kg (118 lb 6.4 oz)] 53.706 kg (118 lb 6.4 oz) (07/18 1400) Last BM Date: 12/21/14  Intake/Output from previous day: 07/18 0701 - 07/19 0700 In: 3649.3 [P.O.:1546; I.V.:2103.3] Out: -  Intake/Output this shift: Total I/O In: 360 [P.O.:360] Out: -   PE: Gen:  Alert, NAD, pleasant   Lab Results:   Recent Labs  12/21/14 1635 12/22/14 0441  WBC 10.2 8.0  HGB 12.6 10.6*  HCT 39.0 32.9*  PLT 412* 329   BMET  Recent Labs  12/21/14 0800 12/21/14 1635 12/22/14 0441  NA 143  --  138  K 3.0*  --  3.9  CL 101  --  106  CO2 30  --  28  GLUCOSE 100*  --  84  BUN 7  --  <5*  CREATININE 0.49 0.59 0.53  CALCIUM 9.1  --  8.2*   PT/INR No results for input(s): LABPROT, INR in the last 72 hours. CMP     Component Value Date/Time   NA 138 12/22/2014 0441   K 3.9 12/22/2014 0441   CL 106 12/22/2014 0441   CO2 28 12/22/2014 0441   GLUCOSE 84 12/22/2014 0441   BUN <5* 12/22/2014 0441   CREATININE 0.53 12/22/2014 0441   CALCIUM 8.2* 12/22/2014 0441   PROT 7.2 12/21/2014 0800   ALBUMIN 3.1* 12/21/2014 0800   AST 20 12/21/2014 0800   ALT 11* 12/21/2014 0800   ALKPHOS 73 12/21/2014 0800   BILITOT 0.8 12/21/2014 0800   GFRNONAA >60 12/22/2014 0441   GFRAA >60 12/22/2014 0441   Lipase     Component Value Date/Time   LIPASE 10* 12/21/2014 0800       Studies/Results: Dg Chest 2 View  12/21/2014   CLINICAL DATA:  Two day history of difficulty swallowing, symptoms are progressive, possible  knee impaction in the esophagus; history of gastric bypass and reflux  EXAM: CHEST  2 VIEW  COMPARISON:  PA and lateral chest x-ray of May 07, 2015 and abdominal films of today's date  FINDINGS: The lungs are adequately inflated and clear. The heart is normal in size. The mediastinum is normal in width. There is no pleural effusion. There is a inflatable ring at the GE junction. The bony thorax is unremarkable.  IMPRESSION: Mild hyperinflation which may refer reflect reactive airway disease or deep inspiration. There is no acute cardiopulmonary abnormality.   Electronically Signed   By: David  Swaziland M.D.   On: 12/21/2014 07:42   Dg Abd 1 View  12/21/2014   CLINICAL DATA:  Difficulty swallowing for the past few days, history of gastric bypass and placement of an anti reflux inflatable ring at the GE junction.  EXAM: ABDOMEN - 1 VIEW  COMPARISON:  CT scan of the abdomen and pelvis of November 07, 2014  FINDINGS: A known moderate-sized hiatal hernia above the inflatable gastric ring demonstrated on the previous study is not evident today. The radiodense ring appears to be in reasonable position.  The distal esophagus appears to be of normal caliber. There is no small or large bowel obstructive pattern. The colonic stool burden is mildly increased. There are radiodensities that project in the right upper quadrant likely related to costal cartilages. The bony structures are unremarkable.  IMPRESSION: There is no evidence of a hiatal hernia nor small or large bowel obstruction. The inflatable gastric band appears to be in reasonable position. Moderately increased colonic stool burden may reflect clinical constipation in the appropriate setting.   Electronically Signed   By: David  SwazilandJordan M.D.   On: 12/21/2014 07:45    Anti-infectives: Anti-infectives    None       Assessment/Plan Roux-en-Y ([place]) and subsequent gastric banding [facility] with trouble swallowing, nausea and vomiting Weight loss  Large  hiatal hernia above the lap band Positive Troponin Hypothyroid Hypertension  Plan: Patient's symptoms have resolved, no further surgical recommendations.  [facility] August to see about re-filling band as able.  Will sign off, call with questions/cocnerns.  Could go home from our perspective if medically stable.    LOS: 1 day    Nonie HoyerMegan N Fatma Rutten 12/22/2014, 11:17 AM Pager: [phone]

## 2014-12-22 NOTE — Progress Notes (Signed)
Went over all discharge information with pt.  All questions answered.  Explained importance of taking medications as prescribed.  AVS summary given.  Pt refused wheelchair, walked out to car.

## 2014-12-22 NOTE — Discharge Summary (Signed)
Physician Discharge Summary  [name]:[id number] DOB: May 07, 1966 DOA: 12/21/2014  PCP: Gwynneth Aliment, MD  Admit date: 12/21/2014 Discharge date: 12/22/2014  Time spent: 35 minutes  Recommendations for Outpatient Follow-up:  1. Has an appt with PCP on 7/24 - BP needs to be checked- holding antihypertensive  Discharge Condition: stable Diet recommendation: low sodium, heart healhty  Discharge Diagnoses:  Principal Problem:   Elevated troponin Active Problems:   Vomiting   Hypokalemia   Prolonged Q-T interval on ECG   Chest pain   History of present illness:  [name] is a 49 y.o. female With h/o roux in Y, a lap band, HTN, hypothryroidism who presents to the [facility] ER with severe pain from knot in her chest and a foreign body sensation for 2 days which kept her up all night. She ate ribs at a cook out the night it started. She has had 2 months of nausea which is near constant and vomiting of liquids and solids. She is occasionally able to keep down food therefore it is not intractable vomiting. She has had weight loss of about 15 lb in one month. She is having heartburn, constant cough at night and throat hurting from the vomiting.  ER started Omeprazole and something starting with an R about a month ago - she stopped taking it after 7 days as they were causing increased nausea.  Troponin found to be elevated - no cardiac history. She was transferred to [facility] per surgeon's request.   Hospital Course:  Principal Problem:  Elevated troponin - EKG unrevealing - likely GI rather than cardiac- cased discussed by ER doc at [facility] with cardiology, Dr Myrtis Ser, who agrees-second  set of Troponin was negative  Active Problems:   Vomiting/ weight loss - has not seen surgeon in close to 1 yr due to insurance issues and has not had lap band adjusted - has an appt with Dr Lily Peer in Aug to have lap band adjust but symptoms are quite severe and  patient does not want to wait until then - Surgery consulted- Dr Johna Sheriff spoke with ER doc at [facility] and she was sent here to have her lap band adjusted - this was done yesterday - all of the fluid from band was removed and she has been eating without vomiting since. She has heartburn which is a chronic issue - she will see her surgeon in Aug  - band can be re-filled at that time   Hypokalemia/ Prolonged Q-T interval on ECG - due to poor PO intake/ vomiting ? Also on HCTZ - replaced K - Mg normal  HTN- currently slightly hypotensive - hold Triam/ HCTZ- BP still low on discharge  Hypothyroid -cont Synthroid-   Consultations:  Surgery   Discharge Exam: Filed Weights   12/21/14 0704 12/21/14 1400  Weight: 53.706 kg (118 lb 6.4 oz) 53.706 kg (118 lb 6.4 oz)   Filed Vitals:   12/22/14 0511  BP: 106/61  Pulse: 78  Temp: 98 F (36.7 C)  Resp: 18    General: AAO x 3, no distress Cardiovascular: RRR, no murmurs  Respiratory: clear to auscultation bilaterally GI: soft, non-tender, non-distended, bowel sound positive  Discharge Instructions You were cared for by a hospitalist during your hospital stay. If you have any questions about your discharge medications or the care you received while you were in the hospital after you are discharged, you can call the unit and asked to speak with the hospitalist  on call if the hospitalist that took care of you is not available. Once you are discharged, your primary care physician will handle any further medical issues. Please note that NO REFILLS for any discharge medications will be authorized once you are discharged, as it is imperative that you return to your primary care physician (or establish a relationship with a primary care physician if you do not have one) for your aftercare needs so that they can reassess your need for medications and monitor your lab values.  Discharge Instructions    Diet - low sodium heart healthy    Complete  by:  As directed      Discharge instructions    Complete by:  As directed   Do not resume BP pill until your doctor tell you to     Increase activity slowly    Complete by:  As directed             Medication List    STOP taking these medications        triamterene-hydrochlorothiazide 37.5-25 MG per capsule  Commonly known as:  DYAZIDE      TAKE these medications        famotidine 20 MG tablet  Commonly known as:  PEPCID  Take 1 tablet (20 mg total) by mouth 2 (two) times daily as needed for heartburn.     SYNTHROID 112 MCG tablet  Generic drug:  levothyroxine  Take 1 tablet by mouth daily at 12 noon.       Allergies  Allergen Reactions  . Azithromycin Nausea And Vomiting    Pt with abd pain, N and V  . Ibuprofen       The results of significant diagnostics from this hospitalization (including imaging, microbiology, ancillary and laboratory) are listed below for reference.    Significant Diagnostic Studies: Dg Chest 2 View  12/21/2014   CLINICAL DATA:  Two day history of difficulty swallowing, symptoms are progressive, possible knee impaction in the esophagus; history of gastric bypass and reflux  EXAM: CHEST  2 VIEW  COMPARISON:  PA and lateral chest x-ray of May 07, 2015 and abdominal films of today's date  FINDINGS: The lungs are adequately inflated and clear. The heart is normal in size. The mediastinum is normal in width. There is no pleural effusion. There is a inflatable ring at the GE junction. The bony thorax is unremarkable.  IMPRESSION: Mild hyperinflation which may refer reflect reactive airway disease or deep inspiration. There is no acute cardiopulmonary abnormality.   Electronically Signed   By: David  Swaziland M.D.   On: 12/21/2014 07:42   Dg Abd 1 View  12/21/2014   CLINICAL DATA:  Difficulty swallowing for the past few days, history of gastric bypass and placement of an anti reflux inflatable ring at the GE junction.  EXAM: ABDOMEN - 1 VIEW   COMPARISON:  CT scan of the abdomen and pelvis of November 07, 2014  FINDINGS: A known moderate-sized hiatal hernia above the inflatable gastric ring demonstrated on the previous study is not evident today. The radiodense ring appears to be in reasonable position. The distal esophagus appears to be of normal caliber. There is no small or large bowel obstructive pattern. The colonic stool burden is mildly increased. There are radiodensities that project in the right upper quadrant likely related to costal cartilages. The bony structures are unremarkable.  IMPRESSION: There is no evidence of a hiatal hernia nor small or large bowel obstruction. The inflatable gastric band  appears to be in reasonable position. Moderately increased colonic stool burden may reflect clinical constipation in the appropriate setting.   Electronically Signed   By: David  [place]ilandJordan M.D.   On: 12/21/2014 07:45    Microbiology: No results found for this or any previous visit (from the past 240 hour(s)).   Labs: Basic Metabolic Panel:  Recent Labs Lab 12/21/14 0800 12/21/14 1635 12/22/14 0441  NA 143  --  138  K 3.0*  --  3.9  CL 101  --  106  CO2 30  --  28  GLUCOSE 100*  --  84  BUN 7  --  <5*  CREATININE 0.49 0.59 0.53  CALCIUM 9.1  --  8.2*  MG  --  2.0  --    Liver Function Tests:  Recent Labs Lab 12/21/14 0800  AST 20  ALT 11*  ALKPHOS 73  BILITOT 0.8  PROT 7.2  ALBUMIN 3.1*    Recent Labs Lab 12/21/14 0800  LIPASE 10*   No results for input(s): AMMONIA in the last 168 hours. CBC:  Recent Labs Lab 12/21/14 0800 12/21/14 1635 12/22/14 0441  WBC 11.9* 10.2 8.0  NEUTROABS 9.6*  --   --   HGB 13.0 12.6 10.6*  HCT 38.8 39.0 32.9*  MCV 85.1 87.6 87.5  PLT 407* 412* 329   Cardiac Enzymes:  Recent Labs Lab 12/21/14 0800 12/21/14 1635  TROPONINI 0.06* <0.03   BNP: BNP (last 3 results) No results for input(s): BNP in the last 8760 hours.  ProBNP (last 3 results) No results for input(s):  PROBNP in the last 8760 hours.  CBG: No results for input(s): GLUCAP in the last 168 hours.     SignedCalvert Cantor:  Beni Turrell, MD [facility] 12/22/2014, 9:53 AM

## 2014-12-22 NOTE — Progress Notes (Signed)
Spoke with pt concerning discharge needs.  Pt states, "I am ok, I don't need anything thanks."

## 2015-03-03 ENCOUNTER — Other Ambulatory Visit: Payer: Self-pay | Admitting: Nurse Practitioner

## 2015-03-03 DIAGNOSIS — K449 Diaphragmatic hernia without obstruction or gangrene: Secondary | ICD-10-CM

## 2015-03-09 ENCOUNTER — Ambulatory Visit
Admission: RE | Admit: 2015-03-09 | Discharge: 2015-03-09 | Disposition: A | Discharge status: Home / Self Care | Payer: BLUE CROSS/BLUE SHIELD | Source: Ambulatory Visit | Attending: Nurse Practitioner | Admitting: Nurse Practitioner

## 2015-03-09 ENCOUNTER — Other Ambulatory Visit: Payer: Self-pay | Admitting: Nurse Practitioner

## 2015-03-09 DIAGNOSIS — K449 Diaphragmatic hernia without obstruction or gangrene: Secondary | ICD-10-CM

## 2015-04-15 ENCOUNTER — Other Ambulatory Visit: Payer: Self-pay | Admitting: Physician Assistant

## 2015-04-15 DIAGNOSIS — Z9884 Bariatric surgery status: Secondary | ICD-10-CM

## 2015-07-07 ENCOUNTER — Ambulatory Visit
Admission: RE | Admit: 2015-07-07 | Discharge: 2015-07-07 | Disposition: A | Discharge status: Home / Self Care | Payer: BLUE CROSS/BLUE SHIELD | Source: Ambulatory Visit | Attending: Physician Assistant | Admitting: Physician Assistant

## 2015-07-07 ENCOUNTER — Other Ambulatory Visit: Payer: Self-pay | Admitting: Physician Assistant

## 2015-07-07 DIAGNOSIS — Z9884 Bariatric surgery status: Secondary | ICD-10-CM

## 2015-07-09 ENCOUNTER — Other Ambulatory Visit: Payer: Self-pay | Admitting: Physician Assistant

## 2015-07-09 DIAGNOSIS — K2289 Other specified disease of esophagus: Secondary | ICD-10-CM

## 2015-07-09 DIAGNOSIS — K228 Other specified diseases of esophagus: Secondary | ICD-10-CM

## 2015-10-14 ENCOUNTER — Other Ambulatory Visit: Payer: BLUE CROSS/BLUE SHIELD

## 2015-11-12 ENCOUNTER — Other Ambulatory Visit: Payer: Self-pay | Admitting: Physician Assistant

## 2015-11-12 ENCOUNTER — Ambulatory Visit
Admission: RE | Admit: 2015-11-12 | Discharge: 2015-11-12 | Disposition: A | Discharge status: Home / Self Care | Payer: 59 | Source: Ambulatory Visit | Attending: Physician Assistant | Admitting: Physician Assistant

## 2015-11-12 DIAGNOSIS — K228 Other specified diseases of esophagus: Secondary | ICD-10-CM

## 2015-11-12 DIAGNOSIS — K2289 Other specified disease of esophagus: Secondary | ICD-10-CM

## 2017-12-27 LAB — LIPID PANEL
Cholesterol: 145 (ref 0–200)
HDL: 84 — AB (ref 35–70)
LDL Cholesterol: 51
LDl/HDL Ratio: 0.6
Triglycerides: 50 (ref 40–160)

## 2017-12-27 LAB — TSH: TSH: 4.15 (ref 0.41–5.90)

## 2017-12-27 LAB — CBC AND DIFFERENTIAL
HCT: 42 (ref 36–46)
Hemoglobin: 14 (ref 12.0–16.0)
WBC: 6.8

## 2017-12-27 LAB — HEMOGLOBIN A1C: HEMOGLOBIN A1C: 5.7

## 2018-03-02 ENCOUNTER — Encounter: Payer: Self-pay | Admitting: Internal Medicine

## 2018-03-02 DIAGNOSIS — E039 Hypothyroidism, unspecified: Secondary | ICD-10-CM | POA: Insufficient documentation

## 2018-03-02 DIAGNOSIS — R7309 Other abnormal glucose: Secondary | ICD-10-CM

## 2018-03-02 DIAGNOSIS — E049 Nontoxic goiter, unspecified: Secondary | ICD-10-CM | POA: Insufficient documentation

## 2018-03-21 ENCOUNTER — Ambulatory Visit: Payer: Self-pay | Admitting: Internal Medicine

## 2018-04-11 ENCOUNTER — Other Ambulatory Visit: Payer: Self-pay | Admitting: Nurse Practitioner

## 2018-07-24 ENCOUNTER — Ambulatory Visit: Payer: Self-pay | Admitting: Internal Medicine

## 2018-08-27 ENCOUNTER — Other Ambulatory Visit: Payer: Self-pay | Admitting: Nurse Practitioner

## 2018-09-04 ENCOUNTER — Ambulatory Visit: Payer: Self-pay | Admitting: Internal Medicine

## 2018-10-21 ENCOUNTER — Ambulatory Visit: Payer: Self-pay | Admitting: Internal Medicine

## 2018-12-09 ENCOUNTER — Ambulatory Visit: Payer: Self-pay | Admitting: Internal Medicine

## 2019-02-03 ENCOUNTER — Ambulatory Visit: Payer: Self-pay | Admitting: Internal Medicine

## 2019-02-27 ENCOUNTER — Other Ambulatory Visit: Payer: Self-pay | Admitting: Internal Medicine

## 2019-06-10 ENCOUNTER — Ambulatory Visit: Payer: Self-pay | Admitting: Nurse Practitioner
